# Patient Record
Sex: Male | Born: 1945
Health system: Southern US, Community
[De-identification: ages and names within clinical notes are randomized; demographics above are authoritative.]

## PROBLEM LIST (undated history)

## (undated) DIAGNOSIS — I10 Essential (primary) hypertension: Secondary | ICD-10-CM

## (undated) DIAGNOSIS — I2510 Atherosclerotic heart disease of native coronary artery without angina pectoris: Secondary | ICD-10-CM

## (undated) DIAGNOSIS — G4730 Sleep apnea, unspecified: Secondary | ICD-10-CM

## (undated) DIAGNOSIS — E785 Hyperlipidemia, unspecified: Secondary | ICD-10-CM

## (undated) DIAGNOSIS — R7303 Prediabetes: Secondary | ICD-10-CM

## (undated) DIAGNOSIS — Z9109 Other allergy status, other than to drugs and biological substances: Secondary | ICD-10-CM

## (undated) HISTORY — PX: UVULOPALATOPHARYNGOPLASTY: SHX827

## (undated) HISTORY — DX: Other allergy status, other than to drugs and biological substances: Z91.09

## (undated) HISTORY — DX: Sleep apnea, unspecified: G47.30

## (undated) HISTORY — PX: THROAT SURGERY: SHX803

---

## 1998-02-24 ENCOUNTER — Ambulatory Visit: Admission: RE | Admit: 1998-02-24 | Discharge: 1998-02-24 | Payer: Self-pay | Admitting: Cardiovascular Disease

## 1998-08-10 ENCOUNTER — Ambulatory Visit (HOSPITAL_COMMUNITY): Admission: RE | Admit: 1998-08-10 | Discharge: 1998-08-10 | Payer: Self-pay | Admitting: Family Medicine

## 2003-09-15 ENCOUNTER — Emergency Department (HOSPITAL_COMMUNITY): Admission: EM | Admit: 2003-09-15 | Discharge: 2003-09-15 | Payer: Self-pay

## 2003-10-08 HISTORY — PX: SPLENECTOMY, TOTAL: SHX788

## 2004-03-22 ENCOUNTER — Inpatient Hospital Stay (HOSPITAL_COMMUNITY): Admission: EM | Admit: 2004-03-22 | Discharge: 2004-03-26 | Payer: Self-pay | Admitting: Emergency Medicine

## 2004-08-08 ENCOUNTER — Ambulatory Visit: Payer: Self-pay | Admitting: Oncology

## 2004-08-20 ENCOUNTER — Inpatient Hospital Stay (HOSPITAL_COMMUNITY): Admission: RE | Admit: 2004-08-20 | Discharge: 2004-08-23 | Payer: Self-pay | Admitting: General Surgery

## 2004-10-19 ENCOUNTER — Ambulatory Visit: Payer: Self-pay | Admitting: Oncology

## 2004-10-24 ENCOUNTER — Encounter: Admission: RE | Admit: 2004-10-24 | Discharge: 2004-10-24 | Payer: Self-pay | Admitting: Neurological Surgery

## 2004-12-11 ENCOUNTER — Ambulatory Visit: Payer: Self-pay | Admitting: Oncology

## 2005-01-08 ENCOUNTER — Ambulatory Visit: Admission: RE | Admit: 2005-01-08 | Discharge: 2005-01-08 | Payer: Self-pay | Admitting: Oncology

## 2005-01-31 ENCOUNTER — Ambulatory Visit: Payer: Self-pay | Admitting: Oncology

## 2005-05-17 ENCOUNTER — Ambulatory Visit: Payer: Self-pay | Admitting: Oncology

## 2005-08-16 ENCOUNTER — Ambulatory Visit: Payer: Self-pay | Admitting: Oncology

## 2006-02-20 ENCOUNTER — Ambulatory Visit: Payer: Self-pay | Admitting: Oncology

## 2006-06-19 ENCOUNTER — Ambulatory Visit: Payer: Self-pay | Admitting: Oncology

## 2006-08-20 ENCOUNTER — Ambulatory Visit: Payer: Self-pay | Admitting: Oncology

## 2006-10-02 ENCOUNTER — Ambulatory Visit: Payer: Self-pay | Admitting: Oncology

## 2011-03-14 ENCOUNTER — Telehealth: Payer: Self-pay | Admitting: *Deleted

## 2011-03-14 NOTE — Telephone Encounter (Signed)
Per CY okay to put pt on for Wed 03-20-11 at 1115am for sleep consult. Pt is aware of appt time and date and is happy with this.

## 2011-03-14 NOTE — Telephone Encounter (Signed)
Spoke with pt and advised needs to sched consult with CDY to obtain new CPAP machine. I scheduled him for the first available appointment with CDY- 04/19/11 at 2 pm.  He is frustrated b/c person who took original msg advised the pt that it would probably be Monday 03/18/11 that we could see him. We wants to check with CDY about this. Please advise if he can be worked in sooner, since was under the impression already that this would not be an issue.

## 2011-03-14 NOTE — Telephone Encounter (Signed)
[email or web address] --pt will need consult with CY for sleep/cpap machine per Katie.  thanks

## 2011-03-18 ENCOUNTER — Encounter: Payer: Self-pay | Admitting: Internal Medicine

## 2011-03-20 ENCOUNTER — Ambulatory Visit (INDEPENDENT_AMBULATORY_CARE_PROVIDER_SITE_OTHER): Payer: Medicare Other | Admitting: Internal Medicine

## 2011-03-20 ENCOUNTER — Encounter: Payer: Self-pay | Admitting: Internal Medicine

## 2011-03-20 VITALS — BP 122/82 | HR 93 | Ht 70.0 in | Wt 310.2 lb

## 2011-03-20 DIAGNOSIS — G4733 Obstructive sleep apnea (adult) (pediatric): Secondary | ICD-10-CM

## 2011-03-20 NOTE — Progress Notes (Signed)
  Subjective:    Patient ID: Kevin Best, male    DOB: Feb 01, 1946, 65 y.o.   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 I had read his original NPSG 02/24/98- AHI 101.7/hr/ CPAP titrated to 13 cwp. He has used CPAP almost continuously since and wife confirms he snores and jerks in his sleep without it. Now needing a new machine. Sleep questions reviewed and charted. Using nasal mask.  He had a UPPP by Dr Haroldine Laws 20 years ago.  Review of Systems Constitutional:   No weight loss, night sweats,  Fevers, chills, fatigue, lassitude. HEENT:   No headaches,  Difficulty swallowing,  Tooth/dental problems,  Sore throat,                No sneezing, itching, ear ache, nasal congestion, post nasal drip,   CV:  No chest pain,  Orthopnea, PND, swelling in lower extremities, anasarca, dizziness, palpitations  GI  No heartburn, indigestion, abdominal pain, nausea, vomiting, diarrhea, change in bowel habits, loss of appetite  Resp: No shortness of breath with exertion or at rest.  No excess mucus, no productive cough,  No non-productive cough,  No coughing up of blood.  No change in color of mucus.  No wheezing.  No chest wall deformity  Skin: no rash or lesions.  GU: no dysuria, change in color of urine, no urgency or frequency.  No flank pain.  MS:  No joint pain or swelling.  No decreased range of motion.  No back pain.  Psych:  No change in mood or affect. No depression or anxiety.  No memory loss.      Objective:   Physical Exam General- Alert, Oriented, Affect-appropriate, Distress- none acute  obese  Skin- rash-none, lesions- none, excoriation- none  Lymphadenopathy- none  Head- atraumatic  Eyes- Gross vision intact, PERRLA, conjunctivae clear secretions  Ears- Hearing, canals, Tm - normal  Nose- Clear, No-Septal dev, mucus, polyps, erosion, perforation   Throat- Mallampati IV and  s/p UPPP     , mucosa clear , drainage- none, tonsils- atrophic  Neck- flexible , trachea midline, no stridor , thyroid nl, carotid no bruit  Chest - symmetrical excursion , unlabored     Heart/CV- RRR , no murmur , no gallop  , no rub, nl s1 s2                     - JVD- none , edema- none, stasis changes- none, varices- none     Lung- clear to P&A, wheeze- none, cough- none , dullness-none, rub- none     Chest wall-  Abd- tender-no, distended-no, bowel sounds-present, HSM- no  Br/ Gen/ Rectal- Not done, not indicated  Extrem- cyanosis- none, clubbing, none, atrophy- none, strength- nl  Neuro- grossly intact to observation         Assessment & Plan:

## 2011-03-20 NOTE — Patient Instructions (Signed)
Order- [facility]-  [facility]- Replace old CPAP machine, heated humidifier. Set at his current pressure per [facility] records, and ask what that pressure is for our records.  Dx OSA

## 2011-03-20 NOTE — Assessment & Plan Note (Addendum)
We will look for an old paper chart with original sleep studies Meanwhile, we will order replacement of his worn out machine, asking [facility] to set it on his prescribed presure of record. We will see if he is required to update documentation.

## 2011-03-24 ENCOUNTER — Encounter: Payer: Self-pay | Admitting: Internal Medicine

## 2011-04-03 ENCOUNTER — Telehealth: Payer: Self-pay | Admitting: Internal Medicine

## 2011-04-03 NOTE — Telephone Encounter (Signed)
Spoke with pt. He states that he has yet to receive his new CPAP machine and needs this asap. [facility] sent him an e-mail with "a bunch of jibrish" and asks that we call them to find out what is needed. I advised that I am unsure, [facility] sent the order to them already. Will forward to [facility]. Please help thanks!

## 2011-04-04 NOTE — Telephone Encounter (Signed)
Pt will be called today by [facility] order was received by them and was waiting on paperwork they now have

## 2011-04-19 ENCOUNTER — Institutional Professional Consult (permissible substitution): Payer: Self-pay | Admitting: Internal Medicine

## 2011-07-31 ENCOUNTER — Other Ambulatory Visit: Payer: Self-pay | Admitting: Physician Assistant

## 2011-10-13 DIAGNOSIS — J209 Acute bronchitis, unspecified: Secondary | ICD-10-CM | POA: Diagnosis not present

## 2011-10-13 DIAGNOSIS — R5381 Other malaise: Secondary | ICD-10-CM | POA: Diagnosis not present

## 2011-10-13 DIAGNOSIS — R5383 Other fatigue: Secondary | ICD-10-CM | POA: Diagnosis not present

## 2011-10-13 DIAGNOSIS — R05 Cough: Secondary | ICD-10-CM | POA: Diagnosis not present

## 2011-11-01 DIAGNOSIS — I10 Essential (primary) hypertension: Secondary | ICD-10-CM | POA: Diagnosis not present

## 2011-11-01 DIAGNOSIS — M109 Gout, unspecified: Secondary | ICD-10-CM | POA: Diagnosis not present

## 2011-11-01 DIAGNOSIS — H6690 Otitis media, unspecified, unspecified ear: Secondary | ICD-10-CM | POA: Diagnosis not present

## 2012-04-23 DIAGNOSIS — Z136 Encounter for screening for cardiovascular disorders: Secondary | ICD-10-CM | POA: Diagnosis not present

## 2012-04-23 DIAGNOSIS — G4733 Obstructive sleep apnea (adult) (pediatric): Secondary | ICD-10-CM | POA: Diagnosis not present

## 2012-04-23 DIAGNOSIS — Q8909 Congenital malformations of spleen: Secondary | ICD-10-CM | POA: Diagnosis not present

## 2012-04-23 DIAGNOSIS — Z1211 Encounter for screening for malignant neoplasm of colon: Secondary | ICD-10-CM | POA: Diagnosis not present

## 2012-04-23 DIAGNOSIS — Z125 Encounter for screening for malignant neoplasm of prostate: Secondary | ICD-10-CM | POA: Diagnosis not present

## 2012-04-23 DIAGNOSIS — Z0000 Encounter for general adult medical examination without abnormal findings: Secondary | ICD-10-CM | POA: Diagnosis not present

## 2012-04-23 DIAGNOSIS — M109 Gout, unspecified: Secondary | ICD-10-CM | POA: Diagnosis not present

## 2012-04-23 DIAGNOSIS — I10 Essential (primary) hypertension: Secondary | ICD-10-CM | POA: Diagnosis not present

## 2012-04-23 DIAGNOSIS — Z23 Encounter for immunization: Secondary | ICD-10-CM | POA: Diagnosis not present

## 2012-04-23 DIAGNOSIS — Z1159 Encounter for screening for other viral diseases: Secondary | ICD-10-CM | POA: Diagnosis not present

## 2012-06-15 DIAGNOSIS — Z23 Encounter for immunization: Secondary | ICD-10-CM | POA: Diagnosis not present

## 2012-12-03 ENCOUNTER — Encounter (HOSPITAL_COMMUNITY): Payer: Self-pay | Admitting: *Deleted

## 2012-12-03 ENCOUNTER — Emergency Department (HOSPITAL_COMMUNITY)
Admission: EM | Admit: 2012-12-03 | Discharge: 2012-12-03 | Disposition: A | Discharge status: Home / Self Care | Payer: Medicare Other | Attending: Emergency Medicine | Admitting: Emergency Medicine

## 2012-12-03 DIAGNOSIS — Z8669 Personal history of other diseases of the nervous system and sense organs: Secondary | ICD-10-CM | POA: Diagnosis not present

## 2012-12-03 DIAGNOSIS — R040 Epistaxis: Secondary | ICD-10-CM | POA: Diagnosis not present

## 2012-12-03 DIAGNOSIS — Z8709 Personal history of other diseases of the respiratory system: Secondary | ICD-10-CM | POA: Insufficient documentation

## 2012-12-03 NOTE — ED Provider Notes (Signed)
History     CSN: [id number]  Arrival date & time 12/03/12  0101   First MD Initiated Contact with Patient 12/03/12 0202      Chief Complaint  Patient presents with  . Epistaxis    (Consider location/radiation/quality/duration/timing/severity/associated sxs/prior treatment) Patient is a 67 y.o. male presenting with nosebleeds. The history is provided by the patient.  Epistaxis    patient plan of epistaxis from his right nares that started last night. Bleeding has been controlled this time. Prior history of nosebleed from the right side. Denies any digital trauma. No bleeding in the back of his throat. No bruising of his body. No weakness or lightheadedness with standing. Applied direct pressure with good results  Past Medical History  Diagnosis Date  . Environmental allergies   . Sleep apnea   . ALLERGIC RHINITIS     Past Surgical History  Procedure Laterality Date  . Splenectomy, total  2005  . Throat surgery    . Uvulopalatopharyngoplasty      Family History  Problem Relation Age of Onset  . Melanoma Father     History  Substance Use Topics  . Smoking status: Never Smoker   . Smokeless tobacco: Not on file  . Alcohol Use: No      Review of Systems  HENT: Positive for nosebleeds.   All other systems reviewed and are negative.    Allergies  Review of patient's allergies indicates no known allergies.  Home Medications  No current outpatient prescriptions on file.  BP 138/89  Pulse 100  Temp(Src) 96.8 F (36 C) (Oral)  Resp 18  SpO2 96%  Physical Exam  Nursing note and vitals reviewed. Constitutional: He is oriented to person, place, and time. He appears well-developed and well-nourished.  Non-toxic appearance. No distress.  HENT:  Head: Normocephalic and atraumatic.  Right nares with intact packing. No blood in the posterior pharynx. Left nares without acute bleeding  Eyes: Conjunctivae, EOM and lids are normal. Pupils are equal, round, and  reactive to light.  Neck: Normal range of motion. Neck supple. No tracheal deviation present. No mass present.  Cardiovascular: Normal rate, regular rhythm and normal heart sounds.  Exam reveals no gallop.   No murmur heard. Pulmonary/Chest: Effort normal and breath sounds normal. No stridor. No respiratory distress. He has no decreased breath sounds. He has no wheezes. He has no rhonchi. He has no rales.  Abdominal: Soft. Normal appearance and bowel sounds are normal. He exhibits no distension. There is no tenderness. There is no rebound and no CVA tenderness.  Musculoskeletal: Normal range of motion. He exhibits no edema and no tenderness.  Neurological: He is alert and oriented to person, place, and time. He has normal strength. No cranial nerve deficit or sensory deficit. GCS eye subscore is 4. GCS verbal subscore is 5. GCS motor subscore is 6.  Skin: Skin is warm and dry. No abrasion and no rash noted.  Psychiatric: He has a normal mood and affect. His speech is normal and behavior is normal.    ED Course  Procedures (including critical care time)  Labs Reviewed - No data to display No results found.   No diagnosis found.    MDM  Patient offered to have his packing removed in the right side which she has deferred. Encouraged to return should the bleeding come back        Toy Baker, MD 12/03/12 [phone]

## 2012-12-03 NOTE — ED Notes (Signed)
Pt states nosebleed since 7pm. Denies home medications.

## 2013-01-15 DIAGNOSIS — R05 Cough: Secondary | ICD-10-CM | POA: Diagnosis not present

## 2013-01-29 DIAGNOSIS — J069 Acute upper respiratory infection, unspecified: Secondary | ICD-10-CM | POA: Diagnosis not present

## 2013-12-08 DIAGNOSIS — J019 Acute sinusitis, unspecified: Secondary | ICD-10-CM | POA: Diagnosis not present

## 2013-12-08 DIAGNOSIS — R350 Frequency of micturition: Secondary | ICD-10-CM | POA: Diagnosis not present

## 2013-12-08 DIAGNOSIS — N390 Urinary tract infection, site not specified: Secondary | ICD-10-CM | POA: Diagnosis not present

## 2014-09-26 DIAGNOSIS — G4733 Obstructive sleep apnea (adult) (pediatric): Secondary | ICD-10-CM | POA: Diagnosis not present

## 2014-09-26 DIAGNOSIS — Z0000 Encounter for general adult medical examination without abnormal findings: Secondary | ICD-10-CM | POA: Diagnosis not present

## 2014-09-26 DIAGNOSIS — R7309 Other abnormal glucose: Secondary | ICD-10-CM | POA: Diagnosis not present

## 2014-09-26 DIAGNOSIS — Q8901 Asplenia (congenital): Secondary | ICD-10-CM | POA: Diagnosis not present

## 2014-09-26 DIAGNOSIS — M109 Gout, unspecified: Secondary | ICD-10-CM | POA: Diagnosis not present

## 2014-09-26 DIAGNOSIS — Z1389 Encounter for screening for other disorder: Secondary | ICD-10-CM | POA: Diagnosis not present

## 2014-09-26 DIAGNOSIS — Z1211 Encounter for screening for malignant neoplasm of colon: Secondary | ICD-10-CM | POA: Diagnosis not present

## 2014-09-26 DIAGNOSIS — Z23 Encounter for immunization: Secondary | ICD-10-CM | POA: Diagnosis not present

## 2014-09-26 DIAGNOSIS — I10 Essential (primary) hypertension: Secondary | ICD-10-CM | POA: Diagnosis not present

## 2014-09-26 DIAGNOSIS — Z125 Encounter for screening for malignant neoplasm of prostate: Secondary | ICD-10-CM | POA: Diagnosis not present

## 2014-11-01 DIAGNOSIS — Z1211 Encounter for screening for malignant neoplasm of colon: Secondary | ICD-10-CM | POA: Diagnosis not present

## 2014-12-20 DIAGNOSIS — J019 Acute sinusitis, unspecified: Secondary | ICD-10-CM | POA: Diagnosis not present

## 2014-12-20 DIAGNOSIS — J189 Pneumonia, unspecified organism: Secondary | ICD-10-CM | POA: Diagnosis not present

## 2014-12-27 DIAGNOSIS — Q8901 Asplenia (congenital): Secondary | ICD-10-CM | POA: Diagnosis not present

## 2014-12-27 DIAGNOSIS — R7309 Other abnormal glucose: Secondary | ICD-10-CM | POA: Diagnosis not present

## 2014-12-27 DIAGNOSIS — M109 Gout, unspecified: Secondary | ICD-10-CM | POA: Diagnosis not present

## 2014-12-27 DIAGNOSIS — G4733 Obstructive sleep apnea (adult) (pediatric): Secondary | ICD-10-CM | POA: Diagnosis not present

## 2014-12-27 DIAGNOSIS — I10 Essential (primary) hypertension: Secondary | ICD-10-CM | POA: Diagnosis not present

## 2014-12-27 DIAGNOSIS — J189 Pneumonia, unspecified organism: Secondary | ICD-10-CM | POA: Diagnosis not present

## 2015-02-14 DIAGNOSIS — R3919 Other difficulties with micturition: Secondary | ICD-10-CM | POA: Diagnosis not present

## 2015-02-14 DIAGNOSIS — I10 Essential (primary) hypertension: Secondary | ICD-10-CM | POA: Diagnosis not present

## 2015-02-14 DIAGNOSIS — M545 Low back pain: Secondary | ICD-10-CM | POA: Diagnosis not present

## 2015-02-28 DIAGNOSIS — R899 Unspecified abnormal finding in specimens from other organs, systems and tissues: Secondary | ICD-10-CM | POA: Diagnosis not present

## 2015-03-30 DIAGNOSIS — Q8901 Asplenia (congenital): Secondary | ICD-10-CM | POA: Diagnosis not present

## 2015-03-30 DIAGNOSIS — R7309 Other abnormal glucose: Secondary | ICD-10-CM | POA: Diagnosis not present

## 2015-03-30 DIAGNOSIS — I10 Essential (primary) hypertension: Secondary | ICD-10-CM | POA: Diagnosis not present

## 2015-03-30 DIAGNOSIS — N529 Male erectile dysfunction, unspecified: Secondary | ICD-10-CM | POA: Diagnosis not present

## 2015-03-30 DIAGNOSIS — M109 Gout, unspecified: Secondary | ICD-10-CM | POA: Diagnosis not present

## 2015-03-30 DIAGNOSIS — G4733 Obstructive sleep apnea (adult) (pediatric): Secondary | ICD-10-CM | POA: Diagnosis not present

## 2015-05-11 DIAGNOSIS — J069 Acute upper respiratory infection, unspecified: Secondary | ICD-10-CM | POA: Diagnosis not present

## 2015-05-11 DIAGNOSIS — H6691 Otitis media, unspecified, right ear: Secondary | ICD-10-CM | POA: Diagnosis not present

## 2015-05-23 ENCOUNTER — Inpatient Hospital Stay (HOSPITAL_COMMUNITY): Payer: Medicare Other

## 2015-05-23 ENCOUNTER — Encounter (HOSPITAL_COMMUNITY): Payer: Self-pay | Admitting: Neurology

## 2015-05-23 ENCOUNTER — Encounter (HOSPITAL_COMMUNITY)
Admission: EM | Disposition: A | Discharge status: Home / Self Care | Payer: Self-pay | Source: Home / Self Care | Attending: Cardiology

## 2015-05-23 ENCOUNTER — Inpatient Hospital Stay (HOSPITAL_COMMUNITY)
Admission: EM | Admit: 2015-05-23 | Discharge: 2015-05-25 | DRG: 247 | Disposition: A | Discharge status: Home / Self Care | Payer: Medicare Other | Attending: Cardiology | Admitting: Cardiology

## 2015-05-23 DIAGNOSIS — E785 Hyperlipidemia, unspecified: Secondary | ICD-10-CM | POA: Diagnosis not present

## 2015-05-23 DIAGNOSIS — I2119 ST elevation (STEMI) myocardial infarction involving other coronary artery of inferior wall: Principal | ICD-10-CM | POA: Diagnosis present

## 2015-05-23 DIAGNOSIS — I252 Old myocardial infarction: Secondary | ICD-10-CM | POA: Diagnosis not present

## 2015-05-23 DIAGNOSIS — I2510 Atherosclerotic heart disease of native coronary artery without angina pectoris: Secondary | ICD-10-CM | POA: Diagnosis not present

## 2015-05-23 DIAGNOSIS — Z7901 Long term (current) use of anticoagulants: Secondary | ICD-10-CM | POA: Diagnosis not present

## 2015-05-23 DIAGNOSIS — Z79899 Other long term (current) drug therapy: Secondary | ICD-10-CM | POA: Diagnosis not present

## 2015-05-23 DIAGNOSIS — Z7982 Long term (current) use of aspirin: Secondary | ICD-10-CM

## 2015-05-23 DIAGNOSIS — G4733 Obstructive sleep apnea (adult) (pediatric): Secondary | ICD-10-CM | POA: Diagnosis present

## 2015-05-23 DIAGNOSIS — R079 Chest pain, unspecified: Secondary | ICD-10-CM

## 2015-05-23 DIAGNOSIS — Z9081 Acquired absence of spleen: Secondary | ICD-10-CM | POA: Diagnosis present

## 2015-05-23 DIAGNOSIS — Z6841 Body Mass Index (BMI) 40.0 and over, adult: Secondary | ICD-10-CM

## 2015-05-23 DIAGNOSIS — R0789 Other chest pain: Secondary | ICD-10-CM | POA: Diagnosis not present

## 2015-05-23 DIAGNOSIS — I2111 ST elevation (STEMI) myocardial infarction involving right coronary artery: Secondary | ICD-10-CM

## 2015-05-23 DIAGNOSIS — I10 Essential (primary) hypertension: Secondary | ICD-10-CM | POA: Diagnosis present

## 2015-05-23 DIAGNOSIS — I213 ST elevation (STEMI) myocardial infarction of unspecified site: Secondary | ICD-10-CM

## 2015-05-23 DIAGNOSIS — E118 Type 2 diabetes mellitus with unspecified complications: Secondary | ICD-10-CM | POA: Diagnosis not present

## 2015-05-23 DIAGNOSIS — R319 Hematuria, unspecified: Secondary | ICD-10-CM | POA: Diagnosis present

## 2015-05-23 DIAGNOSIS — R7303 Prediabetes: Secondary | ICD-10-CM

## 2015-05-23 HISTORY — DX: Prediabetes: R73.03

## 2015-05-23 HISTORY — DX: Essential (primary) hypertension: I10

## 2015-05-23 HISTORY — DX: Atherosclerotic heart disease of native coronary artery without angina pectoris: I25.10

## 2015-05-23 HISTORY — PX: CARDIAC CATHETERIZATION: SHX172

## 2015-05-23 HISTORY — DX: Hyperlipidemia, unspecified: E78.5

## 2015-05-23 LAB — HEPATIC FUNCTION PANEL
ALBUMIN: 3.7 g/dL (ref 3.5–5.0)
ALK PHOS: 92 U/L (ref 38–126)
ALT: 78 U/L — AB (ref 17–63)
AST: 69 U/L — AB (ref 15–41)
Bilirubin, Direct: 0.2 mg/dL (ref 0.1–0.5)
Indirect Bilirubin: 0.6 mg/dL (ref 0.3–0.9)
TOTAL PROTEIN: 7.1 g/dL (ref 6.5–8.1)
Total Bilirubin: 0.8 mg/dL (ref 0.3–1.2)

## 2015-05-23 LAB — LIPID PANEL
CHOL/HDL RATIO: 5.1 ratio
Cholesterol: 189 mg/dL (ref 0–200)
HDL: 37 mg/dL — AB (ref >40)
LDL CALC: 124 mg/dL — AB (ref 0–99)
Triglycerides: 142 mg/dL (ref <150)
VLDL: 28 mg/dL (ref 0–40)

## 2015-05-23 LAB — CBC
HEMATOCRIT: 54.9 % — AB (ref 39.0–52.0)
HEMOGLOBIN: 19.1 g/dL — AB (ref 13.0–17.0)
MCH: 33.3 pg (ref 26.0–34.0)
MCHC: 34.8 g/dL (ref 30.0–36.0)
MCV: 95.8 fL (ref 78.0–100.0)
Platelets: 187 10*3/uL (ref 150–400)
RBC: 5.73 MIL/uL (ref 4.22–5.81)
RDW: 13.4 % (ref 11.5–15.5)
WBC: 16.6 10*3/uL — AB (ref 4.0–10.5)

## 2015-05-23 LAB — BASIC METABOLIC PANEL
ANION GAP: 12 (ref 5–15)
BUN: 13 mg/dL (ref 6–20)
CHLORIDE: 105 mmol/L (ref 101–111)
CO2: 20 mmol/L — AB (ref 22–32)
CREATININE: 1.11 mg/dL (ref 0.61–1.24)
Calcium: 9.2 mg/dL (ref 8.9–10.3)
GFR calc non Af Amer: >60 mL/min (ref >60)
Glucose, Bld: 153 mg/dL — ABNORMAL HIGH (ref 65–99)
POTASSIUM: 4.5 mmol/L (ref 3.5–5.1)
Sodium: 137 mmol/L (ref 135–145)

## 2015-05-23 LAB — POCT I-STAT, CHEM 8
BUN: 16 mg/dL (ref 6–20)
Calcium, Ion: 1.18 mmol/L (ref 1.13–1.30)
Chloride: 105 mmol/L (ref 101–111)
Creatinine, Ser: 1 mg/dL (ref 0.61–1.24)
Glucose, Bld: 146 mg/dL — ABNORMAL HIGH (ref 65–99)
HEMATOCRIT: 57 % — AB (ref 39.0–52.0)
HEMOGLOBIN: 19.4 g/dL — AB (ref 13.0–17.0)
Potassium: 4.1 mmol/L (ref 3.5–5.1)
SODIUM: 140 mmol/L (ref 135–145)
TCO2: 21 mmol/L (ref 0–100)

## 2015-05-23 LAB — PROTIME-INR
INR: 0.98 (ref 0.00–1.49)
PROTHROMBIN TIME: 13.2 s (ref 11.6–15.2)

## 2015-05-23 LAB — POCT I-STAT TROPONIN I: Troponin i, poc: 0.48 ng/mL (ref 0.00–0.08)

## 2015-05-23 LAB — CK TOTAL AND CKMB (NOT AT ARMC)
CK, MB: 13.9 ng/mL — ABNORMAL HIGH (ref 0.5–5.0)
RELATIVE INDEX: 6.5 — AB (ref 0.0–2.5)
Total CK: 213 U/L (ref 49–397)

## 2015-05-23 LAB — MRSA PCR SCREENING: MRSA BY PCR: NEGATIVE

## 2015-05-23 LAB — APTT: aPTT: 30 seconds (ref 24–37)

## 2015-05-23 LAB — TROPONIN I
Troponin I: 0.32 ng/mL — ABNORMAL HIGH (ref <0.031)
Troponin I: 2.99 ng/mL (ref <0.031)

## 2015-05-23 LAB — POCT ACTIVATED CLOTTING TIME: Activated Clotting Time: >1000 seconds

## 2015-05-23 SURGERY — LEFT HEART CATH AND CORONARY ANGIOGRAPHY
Anesthesia: LOCAL

## 2015-05-23 MED ORDER — VERAPAMIL HCL 2.5 MG/ML IV SOLN
INTRAVENOUS | Status: DC | PRN
  Administered 2015-05-23: 17:00:00 via INTRA_ARTERIAL

## 2015-05-23 MED ORDER — ATORVASTATIN CALCIUM 80 MG PO TABS: 80.0000 mg | ORAL_TABLET | Freq: Every day | ORAL | Status: DC

## 2015-05-23 MED ORDER — HEPARIN SODIUM (PORCINE) 5000 UNIT/ML IJ SOLN
5000.0000 [IU] | Freq: Three times a day (TID) | INTRAMUSCULAR | Status: DC
  Administered 2015-05-24 (×2): 5000 [IU] via SUBCUTANEOUS
  Filled 2015-05-23 (×4): qty 1

## 2015-05-23 MED ORDER — LIDOCAINE HCL (PF) 1 % IJ SOLN
INTRAMUSCULAR | Status: AC
  Filled 2015-05-23: qty 30

## 2015-05-23 MED ORDER — LIDOCAINE HCL (PF) 1 % IJ SOLN
INTRAMUSCULAR | Status: DC | PRN
  Administered 2015-05-23: 17:00:00

## 2015-05-23 MED ORDER — SODIUM CHLORIDE 0.9 % WEIGHT BASED INFUSION
1.0000 mL/kg/h | INTRAVENOUS | Status: AC
  Administered 2015-05-23: 1 mL/kg/h via INTRAVENOUS

## 2015-05-23 MED ORDER — TICAGRELOR 90 MG PO TABS
ORAL_TABLET | ORAL | Status: DC | PRN
  Administered 2015-05-23: 180 mg via ORAL

## 2015-05-23 MED ORDER — SODIUM CHLORIDE 0.9 % IJ SOLN: 3.0000 mL | INTRAMUSCULAR | Status: DC | PRN

## 2015-05-23 MED ORDER — ASPIRIN 81 MG PO CHEW
324.0000 mg | CHEWABLE_TABLET | Freq: Once | ORAL | Status: AC
  Administered 2015-05-23: 324 mg via ORAL

## 2015-05-23 MED ORDER — SODIUM CHLORIDE 0.9 % IV SOLN
1.0000 mg/kg/h | INTRAVENOUS | Status: AC
  Filled 2015-05-23: qty 250

## 2015-05-23 MED ORDER — VERAPAMIL HCL 2.5 MG/ML IV SOLN
INTRAVENOUS | Status: AC
  Filled 2015-05-23: qty 2

## 2015-05-23 MED ORDER — SODIUM CHLORIDE 0.9 % IJ SOLN
3.0000 mL | Freq: Two times a day (BID) | INTRAMUSCULAR | Status: DC
  Administered 2015-05-23 – 2015-05-24 (×2): 3 mL via INTRAVENOUS

## 2015-05-23 MED ORDER — BIVALIRUDIN BOLUS VIA INFUSION - CUPID
INTRAVENOUS | Status: DC | PRN
  Administered 2015-05-23 (×2): 105.75 mg via INTRAVENOUS

## 2015-05-23 MED ORDER — TICAGRELOR 90 MG PO TABS
ORAL_TABLET | ORAL | Status: AC
  Filled 2015-05-23: qty 1

## 2015-05-23 MED ORDER — NITROGLYCERIN IN D5W 200-5 MCG/ML-% IV SOLN
INTRAVENOUS | Status: AC
  Filled 2015-05-23: qty 250

## 2015-05-23 MED ORDER — NITROGLYCERIN IN D5W 200-5 MCG/ML-% IV SOLN
0.0000 ug/min | INTRAVENOUS | Status: DC
  Administered 2015-05-23: 25 ug/min via INTRAVENOUS

## 2015-05-23 MED ORDER — HEPARIN SODIUM (PORCINE) 5000 UNIT/ML IJ SOLN
4000.0000 [IU] | INTRAMUSCULAR | Status: AC
  Administered 2015-05-23: 4000 [IU] via INTRAVENOUS
  Filled 2015-05-23: qty 0.8

## 2015-05-23 MED ORDER — NITROGLYCERIN 1 MG/10 ML FOR IR/CATH LAB
INTRA_ARTERIAL | Status: DC | PRN
  Administered 2015-05-23: 20 ug via INTRACORONARY

## 2015-05-23 MED ORDER — NITROGLYCERIN IN D5W 200-5 MCG/ML-% IV SOLN
INTRAVENOUS | Status: DC | PRN
  Administered 2015-05-23: 10 ug/min via INTRAVENOUS

## 2015-05-23 MED ORDER — ATORVASTATIN CALCIUM 80 MG PO TABS
80.0000 mg | ORAL_TABLET | Freq: Every day | ORAL | Status: DC
  Administered 2015-05-23 – 2015-05-24 (×2): 80 mg via ORAL
  Filled 2015-05-23 (×2): qty 1

## 2015-05-23 MED ORDER — SODIUM CHLORIDE 0.9 % IV SOLN
0.2500 mg/kg/h | INTRAVENOUS | Status: DC
  Filled 2015-05-23: qty 250

## 2015-05-23 MED ORDER — BIVALIRUDIN 250 MG IV SOLR
INTRAVENOUS | Status: AC
  Filled 2015-05-23: qty 250

## 2015-05-23 MED ORDER — ASPIRIN EC 81 MG PO TBEC
81.0000 mg | DELAYED_RELEASE_TABLET | Freq: Every day | ORAL | Status: DC
  Administered 2015-05-24 – 2015-05-25 (×2): 81 mg via ORAL
  Filled 2015-05-23 (×2): qty 1

## 2015-05-23 MED ORDER — ACETAMINOPHEN 325 MG PO TABS
650.0000 mg | ORAL_TABLET | ORAL | Status: DC | PRN
Start: 2015-05-23 — End: 2015-05-25

## 2015-05-23 MED ORDER — SODIUM CHLORIDE 0.9 % IV SOLN: 250.0000 mL | INTRAVENOUS | Status: DC | PRN

## 2015-05-23 MED ORDER — TICAGRELOR 90 MG PO TABS
90.0000 mg | ORAL_TABLET | Freq: Two times a day (BID) | ORAL | Status: DC
  Administered 2015-05-24 – 2015-05-25 (×3): 90 mg via ORAL
  Filled 2015-05-23 (×3): qty 1

## 2015-05-23 MED ORDER — HEPARIN (PORCINE) IN NACL 2-0.9 UNIT/ML-% IJ SOLN
INTRAMUSCULAR | Status: AC
  Filled 2015-05-23: qty 1500

## 2015-05-23 MED ORDER — NITROGLYCERIN 0.4 MG SL SUBL: 0.4000 mg | SUBLINGUAL_TABLET | SUBLINGUAL | Status: DC | PRN

## 2015-05-23 MED ORDER — SODIUM CHLORIDE 0.9 % IV SOLN
250.0000 mg | INTRAVENOUS | Status: DC | PRN
  Administered 2015-05-23: 1.75 mg/kg/h via INTRAVENOUS

## 2015-05-23 MED ORDER — ONDANSETRON HCL 4 MG/2ML IJ SOLN: 4.0000 mg | Freq: Four times a day (QID) | INTRAMUSCULAR | Status: DC | PRN

## 2015-05-23 SURGICAL SUPPLY — 20 items
BALLN EUPHORA RX 2.5X12 (BALLOONS) ×3
BALLN ~~LOC~~ EUPHORA RX 4.5X12 (BALLOONS) ×3
BALLOON EUPHORA RX 2.5X12 (BALLOONS) ×2 IMPLANT
BALLOON ~~LOC~~ EUPHORA RX 4.5X12 (BALLOONS) ×2 IMPLANT
CATH INFINITI 5 FR JL3.5 (CATHETERS) ×3 IMPLANT
CATH INFINITI 5FR ANG PIGTAIL (CATHETERS) ×3 IMPLANT
CATH INFINITI JR4 5F (CATHETERS) ×3 IMPLANT
DEVICE RAD COMP TR BAND LRG (VASCULAR PRODUCTS) ×3 IMPLANT
GLIDESHEATH SLEND SS 6F .021 (SHEATH) ×3 IMPLANT
GUIDE CATH RUNWAY 6FR AL 75 (CATHETERS) ×3 IMPLANT
HOVERMATT SINGLE USE (MISCELLANEOUS) ×3 IMPLANT
KIT ENCORE 26 ADVANTAGE (KITS) ×3 IMPLANT
KIT HEART LEFT (KITS) ×3 IMPLANT
PACK CARDIAC CATHETERIZATION (CUSTOM PROCEDURE TRAY) ×3 IMPLANT
STENT SYNERGY DES 4X16 (Permanent Stent) ×3 IMPLANT
SYR MEDRAD MARK V 150ML (SYRINGE) ×3 IMPLANT
TRANSDUCER W/STOPCOCK (MISCELLANEOUS) ×3 IMPLANT
TUBING CIL FLEX 10 FLL-RA (TUBING) ×3 IMPLANT
WIRE ASAHI PROWATER 180CM (WIRE) ×3 IMPLANT
WIRE SAFE-T 1.5MM-J .035X260CM (WIRE) ×3 IMPLANT

## 2015-05-23 NOTE — ED Provider Notes (Signed)
CSN: [id number]     Arrival date & time 05/23/15  1600 History   First MD Initiated Contact with Patient 05/23/15 1614     Chief Complaint  Patient presents with  . Chest Pain     (Consider location/radiation/quality/duration/timing/severity/associated sxs/prior Treatment) HPI  A LEVEL 5 CAVEAT PERTAINS DUE TO URGENT NEED FOR INTERVENTION Pt presenting with c/o chest pain.  He states chest pain started at 1pm and has continued over the past 3 hours.  Associated with diaphoresis and nausea.  Pain radiates to left arm.  He has had similar pain in the past, but today was worse.  No hx of MI or CAD.  Pt has not taken anything for his symptoms prior to arrival.  Pain currently 6/10.  No fainting.  He states over the past several days he has been feeling weak with no energy.  There are no other associated systemic symptoms, there are no other alleviating or modifying factors.   Past Medical History  Diagnosis Date  . Environmental allergies   . Sleep apnea   . ALLERGIC RHINITIS   . Hypertension   . Prediabetes 05/25/2015  . Hyperlipidemia 05/24/2015  . CAD (coronary artery disease)     s/p inferolateral STEMI 05/23/2015 DES to prox-mid RCA, 100% occluded RPLB 2   Past Surgical History  Procedure Laterality Date  . Splenectomy, total  2005  . Throat surgery    . Uvulopalatopharyngoplasty    . Cardiac catheterization N/A 05/23/2015    Procedure: Left Heart Cath and Coronary Angiography;  Surgeon: Peter [name], MD;  Location: [place] [facility];  Service: Cardiovascular;  Laterality: N/A;  . Cardiac catheterization  05/23/2015    Procedure: Coronary Stent Intervention;  Surgeon: Peter [name], MD;  Location: [place] [facility];  Service: Cardiovascular;;   Family History  Problem Relation Age of Onset  . Melanoma Father    Social History  Substance Use Topics  . Smoking status: Never Smoker   . Smokeless tobacco: None  . Alcohol Use: No    Review of Systems  UNABLE TO OBTAIN ROS  DUE TO LEVEL 5 CAVEAT    Allergies  Review of patient's allergies indicates no known allergies.  Home Medications   Prior to Admission medications   Medication Sig Start Date End Date Taking? Authorizing Provider  aspirin EC 81 MG EC tablet Take 1 tablet (81 mg total) by mouth daily. 05/25/15   Almyra Deforest, PA  atorvastatin (LIPITOR) 80 MG tablet Take 1 tablet (80 mg total) by mouth daily at 6 PM. 05/25/15   Almyra Deforest, PA  losartan (COZAAR) 100 MG tablet Take 100 mg by mouth daily.   Yes Historical Provider, MD  metoprolol succinate (TOPROL-XL) 25 MG 24 hr tablet Take 1 tablet (25 mg total) by mouth daily. 05/25/15   Almyra Deforest, PA  nitroGLYCERIN (NITROSTAT) 0.4 MG SL tablet Place 1 tablet (0.4 mg total) under the tongue every 5 (five) minutes x 3 doses as needed for chest pain. 05/25/15   Almyra Deforest, PA  ticagrelor (BRILINTA) 90 MG TABS tablet Take 1 tablet (90 mg total) by mouth 2 (two) times daily. 05/25/15   Almyra Deforest, PA   BP 126/74 mmHg  Pulse 73  Temp(Src) 98.1 F (36.7 C) (Oral)  Resp 20  Ht 5\' 10"  (1.778 m)  Wt 308 lb 3.3 oz (139.8 kg)  BMI 44.22 kg/m2  SpO2 95%  Vitals reivewed Physical Exam  Physical Examination: General appearance - alert, well appearing, and  in no distress Mental status - alert, oriented to person, place, and time Eyes - no conjunctival injection, no scleral icterus Mouth - mucous membranes moist, pharynx normal without lesions Chest - clear to auscultation, no wheezes, rales or rhonchi, symmetric air entry Heart - normal rate, regular rhythm, normal S1, S2, no murmurs, rubs, clicks or gallops Abdomen - soft, nontender, nondistended, no masses or organomegaly Neurological - alert, oriented, normal speech,  Extremities - peripheral pulses normal, no pedal edema, no clubbing or cyanosis Skin - normal coloration and turgor, no rashes  ED Course  Procedures (including critical care time)  CRITICAL CARE Performed by: Threasa Beards Total critical care time:  35 Critical care time was exclusive of separately billable procedures and treating other patients. Critical care was necessary to treat or prevent imminent or life-threatening deterioration. Critical care was time spent personally by me on the following activities: development of treatment plan with patient and/or surrogate as well as nursing, discussions with consultants, evaluation of patient's response to treatment, examination of patient, obtaining history from patient or surrogate, ordering and performing treatments and interventions, ordering and review of laboratory studies, ordering and review of radiographic studies, pulse oximetry and re-evaluation of patient's condition. Labs Review Labs Reviewed  BASIC METABOLIC PANEL - Abnormal; Notable for the following:    CO2 20 (*)    Glucose, Bld 153 (*)    All other components within normal limits  CBC - Abnormal; Notable for the following:    WBC 16.6 (*)    Hemoglobin 19.1 (*)    HCT 54.9 (*)    All other components within normal limits  CK TOTAL AND CKMB (NOT AT [facility]) - Abnormal; Notable for the following:    CK, MB 13.9 (*)    Relative Index 6.5 (*)    All other components within normal limits  HEPATIC FUNCTION PANEL - Abnormal; Notable for the following:    AST 69 (*)    ALT 78 (*)    All other components within normal limits  LIPID PANEL - Abnormal; Notable for the following:    HDL 37 (*)    LDL Cholesterol 124 (*)    All other components within normal limits  TROPONIN I - Abnormal; Notable for the following:    Troponin I 0.32 (*)    All other components within normal limits  HEMOGLOBIN A1C - Abnormal; Notable for the following:    Hgb A1c MFr Bld 12.8 (*)    All other components within normal limits  BASIC METABOLIC PANEL - Abnormal; Notable for the following:    Glucose, Bld 171 (*)    Calcium 8.8 (*)    All other components within normal limits  CBC - Abnormal; Notable for the following:    WBC 18.0 (*)     Hemoglobin 17.4 (*)    All other components within normal limits  TROPONIN I - Abnormal; Notable for the following:    Troponin I 2.99 (*)    All other components within normal limits  TROPONIN I - Abnormal; Notable for the following:    Troponin I 16.52 (*)    All other components within normal limits  TROPONIN I - Abnormal; Notable for the following:    Troponin I 21.59 (*)    All other components within normal limits  HEMOGLOBIN A1C - Abnormal; Notable for the following:    Hgb A1c MFr Bld 6.0 (*)    All other components within normal limits  LIPID PANEL - Abnormal; Notable  for the following:    HDL 35 (*)    LDL Cholesterol 120 (*)    All other components within normal limits  POCT I-STAT TROPONIN I - Abnormal; Notable for the following:    Troponin i, poc 0.48 (*)    All other components within normal limits  POCT I-STAT, CHEM 8 - Abnormal; Notable for the following:    Glucose, Bld 146 (*)    Hemoglobin 19.4 (*)    HCT 57.0 (*)    All other components within normal limits  MRSA PCR SCREENING  PROTIME-INR  APTT  POCT ACTIVATED CLOTTING TIME    Imaging Review No results found. I have personally reviewed and evaluated these images and lab results as part of my medical decision-making.    Date/Time:  Tuesday May 23 2015 16:09:22 EDT Ventricular Rate:  82 PR Interval:  186 QRS Duration: 102 QT Interval:  382 QTC Calculation: 446 R Axis:   14 Text Interpretation:   Critical Test Result: STEMI Normal sinus rhythm  Inferior infarct , possibly acute Anterolateral infarct , possibly acute  ACUTE MI / STEMI  Consider right ventricular involvement in  acute inferior infarct Abnormal ECG ST elevations are new Confirmed by  Canary Brim  MD,  [phone]) on 05/23/2015 4:24:32 PM MDM   Final diagnoses:  Chest pain  STEMI   4:26 PM pt brought back from triage, 2 IVs, on monitor.  Heparin and aspirin started.  Cardiology at bedside and plan to take to cath lab.      Alfonzo Beers, MD 05/26/15 [phone]

## 2015-05-23 NOTE — ED Notes (Signed)
MD [name] at the bedside.

## 2015-05-23 NOTE — Progress Notes (Signed)
Chaplain responded to Code Semi. Pt wife was at the bedside. Pt was taken to Cath lab. Chaplain escorted spouse to short stay waiting and provided refreshment. Chaplain will inform   05/23/15 1600  Clinical Encounter Type  Visited With Family  Visit Type Spiritual support  Referral From Care management  Spiritual Encounters  Spiritual Needs Emotional   on call Chaplain

## 2015-05-23 NOTE — ED Notes (Signed)
Pt reports this morning after he took a shower he developed central cp and became diaphoretic. C/o pain to left arm at current. Denies cardiac hx. Has been feeling weak for several days with no energy.

## 2015-05-23 NOTE — ED Notes (Signed)
Patient to be taken upstairs at this time. Patient alert and oriented x4. MD [name] at the bedside. Belongings given to family. Patient has Zoll Pads on the patient.

## 2015-05-23 NOTE — H&P (Signed)
Patient ID: Kevin Best MRN: [id number], DOB/AGE: [id number]   Admit date: 05/23/2015   Primary Physician: Dr. [name] Primary Cardiologist: new- Dr. [name]  Pt. Profile:  obese 69 year old Caucasian male with past medical history of hypertension and obstructive sleep apnea on CPAP who did not have prior h/o CAD presented with inferolateral STEMI   Problem List  Past Medical History  Diagnosis Date  . Environmental allergies   . Sleep apnea   . ALLERGIC RHINITIS   . Hypertension     Past Surgical History  Procedure Laterality Date  . Splenectomy, total  2005  . Throat surgery    . Uvulopalatopharyngoplasty       Allergies  No Known Allergies  HPI  The patient is a obese 69 year old Caucasian male with past medical history of hypertension and obstructive sleep apnea on CPAP. He never smoked, not drink and does not have any history of illicit drug use. He denies any significant family history of CAD nor did he ever have h/o CAD himself. He was brought to [facility] on 05/23/2015 after starting to have substernal chest pain around 1 PM radiating down the left arm while taking shower. He also endorsed diaphoresis. Per patient, he has been having intermittent chest discomfort for the past month. Initial EKG showed significant ST elevation >8mm in inferior lead and also V4-V6 with corresponding ST depression in V1 and V2. I will to [facility], he had continuous chest pain 6 out of 10. He was taken emergently to the cath lab for cardiac catheterization.  Per patient, denies any history of stroke, he had recent hematuria and was referred to urology as outpatient. Otherwise, at this time, he denies any significant shortness of breath. As for his blood pressure, the only medication he takes at home his losartan.  Home Medications  Prior to Admission medications   Not on File    Family History  Family History  Problem Relation Age of Onset  . Melanoma  Father     Social History  Social History   Social History  . Marital Status: Married    Spouse Name: N/A  . Number of Children: 3  . Years of Education: N/A   Occupational History  . reitred-construction    Social History Main Topics  . Smoking status: Never Smoker   . Smokeless tobacco: Not on file  . Alcohol Use: No  . Drug Use: No  . Sexual Activity: Not on file   Other Topics Concern  . Not on file   Social History Narrative     Review of Systems General:  No chills, fever Cardiovascular:  No edema, orthopnea, palpitations, paroxysmal nocturnal dyspnea. +chest pain radiating down L arm, diaphoresis. Dermatological: No rash Respiratory: No cough, dyspnea Urologic: No dysuria +occasional hematuria Abdominal:   No nausea, vomiting, diarrhea, bright red blood per rectum, melena, or hematemesis Neurologic:  No visual changes, wkns, changes in mental status. All other systems reviewed and are otherwise negative except as noted above.  Physical Exam  Blood pressure 186/111, pulse 88, temperature 97.8 F (36.6 C), temperature source Oral, resp. rate 18, SpO2 96 %.  General: Pleasant, NAD Psych: Normal affect. Neuro: Alert and oriented X 3. Moves all extremities spontaneously. HEENT: Normal  Neck: Supple without bruits or JVD. Lungs:  Resp regular and unlabored, anterior exam CTA. Heart: RRR no s3, s4, or murmurs. Abdomen: Soft, non-tender, non-distended, BS + x 4.  Extremities: No clubbing, cyanosis or edema. DP/PT/Radials  2+ and equal bilaterally.  Labs  Troponin [facility] of Care Test)  Recent Labs  05/23/15 1622  TROPIPOC 0.48*     ECG  Inferolateral STEMI with significant ST elevation in 2-3 aVF, ST elevation in V4 through V6, corresponding ST depression in V1 and V2.  ASSESSMENT AND PLAN  1. Inferolateral STEMI - initial episode  - does have intermittent hematuria with outpatient urology referral, otherwise no significant bleeding or h/o stroke  -  plan for urgent cardiac catheterization. Admit to ICU and obtain echocardiogram. Add statin, obtain lipid panel. MD to decide on BB base on his HR and BP during cath.   2. HTN: on home losartan  - CBC and BMET pending  3. OSA on CPAP  4. Morbid obesity  Signed, Almyra Deforest, Hershal Coria 05/23/2015, 4:36 PM Patient seen and examined and history reviewed. Agree with above findings and plan. 69 yo WM presents with acute chest pain. Ecg shows marked ST elevation in the inferior and lateral leads. He has a history of HTN. Has experienced chest pain off and on for one month. Felt very exhausted this past week. Will proceed with emergent cardiac cath and PCI.   [name], [place] 05/23/2015 5:41 PM

## 2015-05-24 ENCOUNTER — Encounter (HOSPITAL_COMMUNITY): Payer: Self-pay | Admitting: Cardiology

## 2015-05-24 ENCOUNTER — Inpatient Hospital Stay (HOSPITAL_COMMUNITY): Payer: Medicare Other

## 2015-05-24 DIAGNOSIS — E118 Type 2 diabetes mellitus with unspecified complications: Secondary | ICD-10-CM

## 2015-05-24 DIAGNOSIS — E785 Hyperlipidemia, unspecified: Secondary | ICD-10-CM

## 2015-05-24 DIAGNOSIS — I2510 Atherosclerotic heart disease of native coronary artery without angina pectoris: Secondary | ICD-10-CM

## 2015-05-24 HISTORY — DX: Hyperlipidemia, unspecified: E78.5

## 2015-05-24 LAB — BASIC METABOLIC PANEL
Anion gap: 9 (ref 5–15)
BUN: 12 mg/dL (ref 6–20)
CHLORIDE: 103 mmol/L (ref 101–111)
CO2: 24 mmol/L (ref 22–32)
Calcium: 8.8 mg/dL — ABNORMAL LOW (ref 8.9–10.3)
Creatinine, Ser: 1.13 mg/dL (ref 0.61–1.24)
GFR calc Af Amer: >60 mL/min (ref >60)
GFR calc non Af Amer: >60 mL/min (ref >60)
Glucose, Bld: 171 mg/dL — ABNORMAL HIGH (ref 65–99)
POTASSIUM: 4.1 mmol/L (ref 3.5–5.1)
SODIUM: 136 mmol/L (ref 135–145)

## 2015-05-24 LAB — CBC
HEMATOCRIT: 50.1 % (ref 39.0–52.0)
Hemoglobin: 17.4 g/dL — ABNORMAL HIGH (ref 13.0–17.0)
MCH: 33.2 pg (ref 26.0–34.0)
MCHC: 34.7 g/dL (ref 30.0–36.0)
MCV: 95.6 fL (ref 78.0–100.0)
Platelets: 197 10*3/uL (ref 150–400)
RBC: 5.24 MIL/uL (ref 4.22–5.81)
RDW: 13.4 % (ref 11.5–15.5)
WBC: 18 10*3/uL — AB (ref 4.0–10.5)

## 2015-05-24 LAB — LIPID PANEL
CHOLESTEROL: 173 mg/dL (ref 0–200)
HDL: 35 mg/dL — AB (ref >40)
LDL CALC: 120 mg/dL — AB (ref 0–99)
TRIGLYCERIDES: 92 mg/dL (ref <150)
Total CHOL/HDL Ratio: 4.9 RATIO
VLDL: 18 mg/dL (ref 0–40)

## 2015-05-24 LAB — TROPONIN I
TROPONIN I: 21.59 ng/mL — AB (ref <0.031)
Troponin I: 16.52 ng/mL (ref <0.031)

## 2015-05-24 LAB — HEMOGLOBIN A1C
Hgb A1c MFr Bld: 12.8 % — ABNORMAL HIGH (ref 4.8–5.6)
Mean Plasma Glucose: 321 mg/dL

## 2015-05-24 MED ORDER — PERFLUTREN LIPID MICROSPHERE
INTRAVENOUS | Status: AC
  Administered 2015-05-24: 10:00:00
  Filled 2015-05-24: qty 10

## 2015-05-24 MED ORDER — LOSARTAN POTASSIUM 50 MG PO TABS
100.0000 mg | ORAL_TABLET | Freq: Every day | ORAL | Status: DC
  Administered 2015-05-24 – 2015-05-25 (×2): 100 mg via ORAL
  Filled 2015-05-24 (×2): qty 2

## 2015-05-24 MED ORDER — HYDRALAZINE HCL 20 MG/ML IJ SOLN: 10.0000 mg | Freq: Four times a day (QID) | INTRAMUSCULAR | Status: DC | PRN

## 2015-05-24 MED ORDER — METOPROLOL SUCCINATE ER 25 MG PO TB24
25.0000 mg | ORAL_TABLET | Freq: Every day | ORAL | Status: DC
  Administered 2015-05-24 – 2015-05-25 (×2): 25 mg via ORAL
  Filled 2015-05-24 (×2): qty 1

## 2015-05-24 NOTE — Progress Notes (Signed)
Echocardiogram 2D Echocardiogram has been performed.  Kevin Best 05/24/2015, 10:31 AM

## 2015-05-24 NOTE — Progress Notes (Signed)
EKG CRITICAL VALUE     12 lead EKG performed.  Critical value noted.  Carin Primrose, RN notified.   ,  H, CCT 05/24/2015 7:02 AM

## 2015-05-24 NOTE — Care Management Note (Signed)
Case Management Note  Patient Details  Name: Kevin Best MRN: [id number] Date of Birth: 1945/11/06  Subjective/Objective:        Adm w mi            Action/Plan: lives w wife   Expected Discharge Date:                  Expected Discharge Plan:  Home/Self Care  In-House Referral:     Discharge planning Services  CM Consult, Medication Assistance  Post Acute Care Choice:    Choice offered to:     DME Arranged:    DME Agency:     HH Arranged:    [place] Agency:     Status of Service:     Medicare Important Message Given:    Date Medicare IM Given:    Medicare IM give by:    Date Additional Medicare IM Given:    Additional Medicare Important Message give by:     If discussed at [place] of Stay Meetings, dates discussed:    Additional Comments:ur review done. Gave pt 30day free brilinta card.  Lacretia Leigh, RN 05/24/2015, 9:32 AM

## 2015-05-24 NOTE — Progress Notes (Signed)
Inpatient Diabetes Program Recommendations  AACE/ADA: New Consensus Statement on Inpatient Glycemic Control (2013)  Target Ranges:  Prepandial:   less than 140 mg/dL      Peak postprandial:   less than 180 mg/dL (1-2 hours)      Critically ill patients:  140 - 180 mg/dL   Consult received for elevated A1C 12.8.  CBGs currently do not reflect glucose reported in A1C.  Recommend a repeat A1C to verify.   Will follow. Thank you  Raoul Pitch BSN, RN,CDE Inpatient Diabetes Coordinator [phone] (team pager)

## 2015-05-24 NOTE — Progress Notes (Signed)
CARDIAC REHAB PHASE I   PRE:  Rate/Rhythm: 79 SR c/ PVCs  BP:  Sitting: 169/77        SaO2: 98 RA  MODE:  Ambulation: 700 ft   POST:  Rate/Rhythm: 76 SR  BP:  Sitting: 156/73         SaO2: 98 RA  Pt ambulated 700 ft on RA, independent, brisk, steady gait, tolerated well.  Pt c/o of "feeling a little tired after walk", denies cp, dizziness, DOE, declined rest stop. Began MI/stent education with pt wife at bedside.  Reviewed risk factors, anti-platelet therapy, stent card, activity restrictions, ntg, exercise, and phase 2 cardiac rehab. Gave pt heart healthy diet and low sodium diet handouts to review, will discuss tomorrow. Pt A1C was elevated, however, Diabetes coordinator recommends redraw to verify accuracy. Will educate regarding diabetes diet if applicable. Pt verbalized understanding. Pt agrees to phase 2 cardiac rehab. Will send referral to [facility]. Pt and wife, very receptive to education; to recliner after walk, call bell within reach. Will follow-up tomorrow to finish education.  [phone]     Lenna Sciara, RN, BSN 05/24/2015 12:40 PM

## 2015-05-24 NOTE — Progress Notes (Addendum)
       Patient Name: Kevin Best Date of Encounter: 05/24/2015    SUBJECTIVE: He is doing well. Chest discomfort is completely resolved.  TELEMETRY:  Sinus rhythm. EKG demonstrates traumatic improvement in ST segments compared with admission and post PCI. This minimal inferior ST elevation still present. Filed Vitals:   05/24/15 0530 05/24/15 0600 05/24/15 0630 05/24/15 0923  BP: 134/47 127/71 142/69 165/54  Pulse: 61 58 65 67  Temp:      TempSrc:      Resp:      Height:      Weight:      SpO2: 96% 96% 98%     Intake/Output Summary (Last 24 hours) at 05/24/15 1036 Last data filed at 05/24/15 1000  Gross per 24 hour  Intake 1150.71 ml  Output   1400 ml  Net -249.29 ml   LABS: Basic Metabolic Panel:  Recent Labs  05/23/15 1616 05/23/15 1647 05/24/15 0525  NA 137 140 136  K 4.5 4.1 4.1  CL 105 105 103  CO2 20*  --  24  GLUCOSE 153* 146* 171*  BUN 13 16 12   CREATININE 1.11 1.00 1.13  CALCIUM 9.2  --  8.8*   CBC:  Recent Labs  05/23/15 1616 05/23/15 1647 05/24/15 0525  WBC 16.6*  --  18.0*  HGB 19.1* 19.4* 17.4*  HCT 54.9* 57.0* 50.1  MCV 95.8  --  95.6  PLT 187  --  197   Cardiac Enzymes:  Recent Labs  05/23/15 1620 05/23/15 1831 05/23/15 2322 05/24/15 0525  CKTOTAL 213  --   --   --   CKMB 13.9*  --   --   --   TROPONINI 0.32* 2.99* 16.52* 21.59*   BNP: Invalid input(s): POCBNP Hemoglobin A1C:  Recent Labs  05/23/15 1600  HGBA1C 12.8*   Fasting Lipid Panel:  Recent Labs  05/24/15 0525  CHOL 173  HDL 35*  LDLCALC 120*  TRIG 92  CHOLHDL 4.9    Radiology/Studies:  Chest x-ray reveal no acute process  Physical Exam: Blood pressure 165/54, pulse 67, temperature 98.2 F (36.8 C), temperature source Oral, resp. rate 16, height 5\' 10"  (1.778 m), weight 64.8 kg (142 lb 13.7 oz), SpO2 98 %. Weight change:   Wt Readings from Last 3 Encounters:  05/24/15 64.8 kg (142 lb 13.7 oz)  03/20/11 140.706 kg (310 lb 3.2 oz)    Morbid  obesity Diminished basal breath sounds posteriorly Cardiac exam does not reveal gallop or murmur Extremities reveal no edema Abdomen is obese. No significant tenderness is noted.   ASSESSMENT:  1. Acute inferior myocardial infarction, with ST elevation, treated with mechanical reperfusion and drug-eluting stent implantation. Posterolateral branch embolus with persistent occlusion at completion of the case. 2. Overall normal LV function with inferobasal hypokinesis 3. Morbid obesity and likely sleep apnea 4. Poor risk factor management with likely diabetes, and hypertension not previously well managed 5. Polycythemia, will need further workup to rule out hyperviscosity playing a role and acute infarct  Plan:  1. Start metoprolol succinate 25 mg daily 2. High intensity statin therapy 3. Diabetes teaching 4. Transfer to telemetry 5. Discussed the importance of dual antiplatelets therapy 6. Potential discharge in a.m. assuming all teaching his been done and no complications. 7. Because of polycythemia, he will need a hematology consult to rule out polycythemia vera Signed, SARAH, ZERBY 05/24/2015, 10:36 AM

## 2015-05-25 ENCOUNTER — Encounter (HOSPITAL_COMMUNITY): Payer: Self-pay | Admitting: Physician Assistant

## 2015-05-25 ENCOUNTER — Telehealth: Payer: Self-pay | Admitting: Cardiology

## 2015-05-25 DIAGNOSIS — E785 Hyperlipidemia, unspecified: Secondary | ICD-10-CM

## 2015-05-25 DIAGNOSIS — R7303 Prediabetes: Secondary | ICD-10-CM

## 2015-05-25 HISTORY — DX: Prediabetes: R73.03

## 2015-05-25 LAB — HEMOGLOBIN A1C
HEMOGLOBIN A1C: 6 % — AB (ref 4.8–5.6)
MEAN PLASMA GLUCOSE: 126 mg/dL

## 2015-05-25 MED ORDER — TICAGRELOR 90 MG PO TABS: 90.0000 mg | ORAL_TABLET | Freq: Two times a day (BID) | ORAL | Status: DC

## 2015-05-25 MED ORDER — LIVING WELL WITH DIABETES BOOK
Freq: Once | Status: AC
  Administered 2015-05-25: 09:00:00
  Filled 2015-05-25: qty 1

## 2015-05-25 MED ORDER — METOPROLOL SUCCINATE ER 25 MG PO TB24: 25.0000 mg | ORAL_TABLET | Freq: Every day | ORAL | Status: DC

## 2015-05-25 MED ORDER — NITROGLYCERIN 0.4 MG SL SUBL: 0.4000 mg | SUBLINGUAL_TABLET | SUBLINGUAL | Status: DC | PRN

## 2015-05-25 MED ORDER — ATORVASTATIN CALCIUM 80 MG PO TABS: 80.0000 mg | ORAL_TABLET | Freq: Every day | ORAL | Status: DC

## 2015-05-25 MED ORDER — ASPIRIN 81 MG PO TBEC: 81.0000 mg | DELAYED_RELEASE_TABLET | Freq: Every day | ORAL | Status: AC

## 2015-05-25 NOTE — Telephone Encounter (Signed)
8/18 TCM per [facility] , Cecilie Kicks on 8/25@8am  left vm fro pt

## 2015-05-25 NOTE — Progress Notes (Signed)
Inpatient Diabetes Program Recommendations  AACE/ADA: New Consensus Statement on Inpatient Glycemic Control (2013)  Target Ranges:  Prepandial:   less than 140 mg/dL      Peak postprandial:   less than 180 mg/dL (1-2 hours)      Critically ill patients:  140 - 180 mg/dL   Inpatient Diabetes Program Recommendations HgbA1C: =6  Note: Repeat A1C is more consistent with current glucose levels.  A1C of 6 places patient in a "prediabetes" range.  Will order patient Living Well with Diabetes patient ed workbook.   Will need to follow-up with PCP for ongoing monitoring and management. Thank you  Raoul Pitch BSN, RN,CDE Inpatient Diabetes Coordinator [phone] (team pager)

## 2015-05-25 NOTE — Discharge Summary (Signed)
Discharge Summary   Patient ID: Kevin Best,  MRN: [id number], DOB/AGE: 71-Feb-1947 71 y.o.  Admit date: 05/23/2015 Discharge date: 05/25/2015  Primary Care Provider: Dr. [name] Primary Cardiologist: Dr. [name]  Discharge Diagnoses Principal Problem:   ST elevation myocardial infarction (STEMI) of inferior wall Active Problems:   Obstructive sleep apnea   HTN (hypertension)   Morbid obesity   Hyperlipidemia   Prediabetes   Allergies No Known Allergies  Procedures  Echocardiogram 05/24/2015 LV EF: 60% -  65%  ------------------------------------------------------------------- Indications:   CAD of native vessels 414.01.  ------------------------------------------------------------------- History:  PMH:  Myocardial infarction. Risk factors: Hypertension.  ------------------------------------------------------------------- Study Conclusions  - Left ventricle: The cavity size was normal. Wall thickness was increased in a pattern of mild LVH. Systolic function was normal. The estimated ejection fraction was in the range of 60% to 65%. Wall motion was normal; there were no regional wall motion abnormalities. Doppler parameters are consistent with abnormal left ventricular relaxation (grade 1 diastolic dysfunction). - Aortic valve: There was no stenosis. - Aorta: Ascending aortic diameter: 38 mm (S). - Ascending aorta: The ascending aorta was mildly dilated. - Mitral valve: There was no significant regurgitation. - Right ventricle: Poorly visualized. The cavity size was normal. Systolic function was normal. - Pulmonary arteries: No complete TR doppler jet so unable to estimate PA systolic pressure. - Systemic veins: IVC measured 2.5 cm with > 50% respirophasic variation, suggesting RA pressure 8 mmHg.  Impressions:  - Normal LV size with mild LV hypertrophy. EF 60-65%. Normal RV size and systolic function. No significant  valvular abnormalities.    Cardiac catheterization 05/23/2015 Conclusion     2nd RPLB lesion, 100% stenosed.  There is mild left ventricular systolic dysfunction.  Prox RCA to Mid RCA lesion, 95% stenosed. There is a 0% residual stenosis post intervention.  A drug-eluting stent was placed.  1. Single vessel occlusive CAD 2. Mild LV dysfunction 3. Successful stenting of the proximal RCA with DES.   Plan: DAPT for one year. Risk factor modification. There is still significant ST elevation on Ecg felt to be related to residual PL2 occlusion. The patient is pain free. He may be a candidate for fast track DC.       Hospital Course  The patient is an 69 year old Caucasian male with past medical history of hypertension and obstructive sleep apnea on CPAP. He never smoked, does not drink and does not have any history of illicit drug use. He denies significant family history of CAD. He presented to [facility] on 05/23/2015 as inferolateral STEMI. The chest pain started around 1 PM while taking a shower, discomfort radiating down the left arm. He also endorsed diaphoresis. EKG was consistent with inferolateral STEMI, he was taken emergently to the cath lab for artery catheterization.  Cardiac catheterization on 05/23/2015 showed 100% stenosed 2nd RPLB lesion (Posterolateral branch embolus with persistent occlusion at completion of the case), 85% prox to mid RCA lesion treated with DES. Post cath, patient was transferred to the cardiac ICU. Despite intervention, there was still significant ST elevation post cath which was felt to be related to occluded PL2 but patient was no longer having chest pain. His troponin trended up to 21.59. EKG abnormalities eventually improved. Significant laboratory finding also included hemoglobin A1c of 6.0. Lipid panel showed LDL 120, HDL 35. Patient was started on high-dose statin along with beta blocker on top of home losartan. Other significant laboratory  finding include hemoglobin of 19.1. Given the thrombotic nature  of his coronary blockages, elevated hemoglobin recent concern for polycythemia. Echocardiogram obtained on 05/24/2015 showed EF 71-02%, grade 1 diastolic dysfunction, ascending aortic diameter 2mmHg, no significant valvular abnormalities.   Patient was seen in the morning of 05/25/2015 at which time he denies any significant chest discomfort or shortness breath. He has been ambulated with cardiac rehabilitation without significant discomfort. His right radial cath is stable without significant bleeding or hematoma. He is deemed stable for discharge from cardiology perspective. I have placed repeated emphasis on compliance with aspirin and Brilinta. I have also updated the patient on his elevated hemoglobin, he will need to continue to follow-up with PCP who can refer him to hematology as outpatient for further workup and rule out polycythemia vera.   Discharge Vitals Blood pressure 126/74, pulse 73, temperature 98.1 F (36.7 C), temperature source Oral, resp. rate 20, height 5\' 10"  (1.778 m), weight 308 lb 3.3 oz (139.8 kg), SpO2 95 %.  Filed Weights   05/23/15 1800 05/24/15 0401 05/25/15 0438  Weight: 307 lb 8.7 oz (139.5 kg) 142 lb 13.7 oz (64.8 kg) 308 lb 3.3 oz (139.8 kg)    Labs  CBC  Recent Labs  05/23/15 1616 05/23/15 1647 05/24/15 0525  WBC 16.6*  --  18.0*  HGB 19.1* 19.4* 17.4*  HCT 54.9* 57.0* 50.1  MCV 95.8  --  95.6  PLT 187  --  637   Basic Metabolic Panel  Recent Labs  05/23/15 1616 05/23/15 1647 05/24/15 0525  NA 137 140 136  K 4.5 4.1 4.1  CL 105 105 103  CO2 20*  --  24  GLUCOSE 153* 146* 171*  BUN 13 16 12   CREATININE 1.11 1.00 1.13  CALCIUM 9.2  --  8.8*   Liver Function Tests  Recent Labs  05/23/15 1620  AST 69*  ALT 78*  ALKPHOS 92  BILITOT 0.8  PROT 7.1  ALBUMIN 3.7   Cardiac Enzymes  Recent Labs  05/23/15 1620 05/23/15 1831 05/23/15 2322 05/24/15 0525  CKTOTAL 213   --   --   --   CKMB 13.9*  --   --   --   TROPONINI 0.32* 2.99* 16.52* 21.59*   Hemoglobin A1C  Recent Labs  05/24/15 0525  HGBA1C 6.0*   Fasting Lipid Panel  Recent Labs  05/24/15 0525  CHOL 173  HDL 35*  LDLCALC 120*  TRIG 92  CHOLHDL 4.9    Disposition  Pt is being discharged home today in good condition.  Follow-up Plans & Appointments      Follow-up Information    Follow up with Murray Hodgkins, NP On 06/02/2015.   Specialties:  Nurse Practitioner, Cardiology, Radiology   Why:  8:00am. Cardiology followup   Contact information:   [address] [place] [address] [phone]       Follow up with Gara Kroner, MD.   Specialty:  Family Medicine   Why:  Please schedule a followup with your primary care physician as soon as possible and discuss the need for hematology referral to rule out polycythemia vera   Contact information:   [place] [address] [phone]       Discharge Medications    Medication List    TAKE these medications        aspirin 81 MG EC tablet  Take 1 tablet (81 mg total) by mouth daily.     atorvastatin 80 MG tablet  Commonly known as:  LIPITOR  Take 1 tablet (80 mg total) by mouth daily at 6 PM.     losartan 100 MG tablet  Commonly known as:  COZAAR  Take 100 mg by mouth daily.     metoprolol succinate 25 MG 24 hr tablet  Commonly known as:  TOPROL-XL  Take 1 tablet (25 mg total) by mouth daily.     nitroGLYCERIN 0.4 MG SL tablet  Commonly known as:  NITROSTAT  Place 1 tablet (0.4 mg total) under the tongue every 5 (five) minutes x 3 doses as needed for chest pain.     ticagrelor 90 MG Tabs tablet  Commonly known as:  BRILINTA  Take 1 tablet (90 mg total) by mouth 2 (two) times daily.         Duration of Discharge Encounter   Greater than 30 minutes including physician time.  Hilbert Corrigan PA-C Pager: [phone] 05/25/2015, 11:52 AM

## 2015-05-25 NOTE — Discharge Instructions (Signed)
No driving for 24 hours. No lifting over 5 lbs for 1 week. No sexual activity for 1 week. Keep procedure site clean & dry. If you notice increased pain, swelling, bleeding or pus, call/return!  You may shower, but no soaking baths/hot tubs/pools for 1 week.  ° ° °

## 2015-05-25 NOTE — Progress Notes (Signed)
Patient Name: Kevin Best Date of Encounter: 05/25/2015  Primary cardiologist: Dr. [name]   Principal Problem:   ST elevation myocardial infarction (STEMI) of inferior wall Active Problems:   Obstructive sleep apnea   HTN (hypertension)   Type 2 diabetes mellitus with other circulatory complications   Morbid obesity   Hyperlipidemia    SUBJECTIVE  Denies any CP or SOB.   CURRENT MEDS . aspirin EC  81 mg Oral Daily  . atorvastatin  80 mg Oral q1800  . heparin  5,000 Units Subcutaneous 3 times per day  . losartan  100 mg Oral Daily  . metoprolol succinate  25 mg Oral Daily  . ticagrelor  90 mg Oral BID    OBJECTIVE  Filed Vitals:   05/24/15 1817 05/24/15 2218 05/25/15 0438 05/25/15 0536  BP: 151/85 148/67  126/74  Pulse: 65 63  73  Temp: 98.1 F (36.7 C) 98.6 F (37 C)  98.1 F (36.7 C)  TempSrc: Oral Oral  Oral  Resp:  18  20  Height:      Weight:   308 lb 3.3 oz (139.8 kg)   SpO2: 98% 98%  95%    Intake/Output Summary (Last 24 hours) at 05/25/15 0901 Last data filed at 05/25/15 0437  Gross per 24 hour  Intake    723 ml  Output   2975 ml  Net  -2252 ml   Filed Weights   05/23/15 1800 05/24/15 0401 05/25/15 0438  Weight: 307 lb 8.7 oz (139.5 kg) 142 lb 13.7 oz (64.8 kg) 308 lb 3.3 oz (139.8 kg)    PHYSICAL EXAM  General: Pleasant, NAD. Neuro: Alert and oriented X 3. Moves all extremities spontaneously. Psych: Normal affect. HEENT:  Normal  Neck: Supple without bruits or JVD. Lungs:  Resp regular and unlabored, CTA. Heart: RRR no s3, s4, or murmurs. R radial cath site stable, no significant bleeding or hematoma  Abdomen: Soft, non-tender, non-distended, BS + x 4.  Extremities: No clubbing, cyanosis or edema. DP/PT/Radials 2+ and equal bilaterally.  Accessory Clinical Findings  CBC  Recent Labs  05/23/15 1616 05/23/15 1647 05/24/15 0525  WBC 16.6*  --  18.0*  HGB 19.1* 19.4* 17.4*  HCT 54.9* 57.0* 50.1  MCV 95.8  --  95.6  PLT 187  --   759   Basic Metabolic Panel  Recent Labs  05/23/15 1616 05/23/15 1647 05/24/15 0525  NA 137 140 136  K 4.5 4.1 4.1  CL 105 105 103  CO2 20*  --  24  GLUCOSE 153* 146* 171*  BUN 13 16 12   CREATININE 1.11 1.00 1.13  CALCIUM 9.2  --  8.8*   Liver Function Tests  Recent Labs  05/23/15 1620  AST 69*  ALT 78*  ALKPHOS 92  BILITOT 0.8  PROT 7.1  ALBUMIN 3.7   Cardiac Enzymes  Recent Labs  05/23/15 1620 05/23/15 1831 05/23/15 2322 05/24/15 0525  CKTOTAL 213  --   --   --   CKMB 13.9*  --   --   --   TROPONINI 0.32* 2.99* 16.52* 21.59*   Hemoglobin A1C  Recent Labs  05/24/15 0525  HGBA1C 6.0*   Fasting Lipid Panel  Recent Labs  05/24/15 0525  CHOL 173  HDL 35*  LDLCALC 120*  TRIG 92  CHOLHDL 4.9    TELE NSR with HR 50-80s    ECG  No new EKG  Echocardiogram 05/24/2015  LV EF: 60% -  65%  -------------------------------------------------------------------  Indications:   CAD of native vessels 414.01.  ------------------------------------------------------------------- History:  PMH:  Myocardial infarction. Risk factors: Hypertension.  ------------------------------------------------------------------- Study Conclusions  - Left ventricle: The cavity size was normal. Wall thickness was increased in a pattern of mild LVH. Systolic function was normal. The estimated ejection fraction was in the range of 60% to 65%. Wall motion was normal; there were no regional wall motion abnormalities. Doppler parameters are consistent with abnormal left ventricular relaxation (grade 1 diastolic dysfunction). - Aortic valve: There was no stenosis. - Aorta: Ascending aortic diameter: 38 mm (S). - Ascending aorta: The ascending aorta was mildly dilated. - Mitral valve: There was no significant regurgitation. - Right ventricle: Poorly visualized. The cavity size was normal. Systolic function was normal. - Pulmonary arteries: No complete  TR doppler jet so unable to estimate PA systolic pressure. - Systemic veins: IVC measured 2.5 cm with > 50% respirophasic variation, suggesting RA pressure 8 mmHg.  Impressions:  - Normal LV size with mild LV hypertrophy. EF 60-65%. Normal RV size and systolic function. No significant valvular abnormalities.    Radiology/Studies  Dg Chest Port 1 View  05/23/2015   CLINICAL DATA:  Chest pain after cardiac catheterization  EXAM: PORTABLE CHEST - 1 VIEW  COMPARISON:  03/22/2004  FINDINGS: Cardiac silhouette enlargement of moderate severity, stable. Vascular pattern normal. Lungs clear although right costophrenic angle is excluded from the image.  IMPRESSION: Study limited by exclusion of a portion of the inferolateral right lung. Otherwise no acute findings.   Electronically Signed   By: Skipper Cliche M.D.   On: 05/23/2015 18:06    ASSESSMENT AND PLAN  1. Inferolateral STEMI  - Cath 05/23/2015 100% stenosed 2nd RPLB lesion (Posterolateral branch embolus with persistent occlusion at completion of the case), 85% prox to mid RCA lesion treated with DES.    - Echo 05/24/2015 EF 86-76%, grade 1 diastolic dysfunction, ascending aortic diameter 68mmHg, no significant valvular abnormalities.   - Continue ASA, lipitor, losartan, metoprolol and brilinta.   - stable for discharge, will arrange 7 day transition of care followup. Need outpatient consult with hematology  2. HTN  3. Hyperlipidemia  4. Prediabetes: seen by diabetes coordinator, need continuous followup with PCP   5. Polycythemia: per Dr. Tamala Julian, will need hematology consult to rule out polycythemia vera   6. OSA on CPAP  7. Morbid obesity  Signed, Almyra Deforest PA-C Pager: [phone]

## 2015-05-25 NOTE — Progress Notes (Signed)
CARDIAC REHAB PHASE I   PRE:  Rate/Rhythm: 60 SR  BP:  Sitting: 144/74        SaO2: 96 RA  MODE:  Ambulation: 1000 ft   POST:  Rate/Rhythm: 90 SR  BP:  Sitting: 160/73         SaO2: 99 RA   Pt in bed, eager to go home, states he walked to the solarium and back this morning with no difficulty. Pt ambulated 1000 ft on RA, independent, brisk, steady gait, tolerated well.  Pt denies DOE, cp, dizziness, declined rest stop. Completed MI/stent education.  Reviewed heart healthy diet, carb counting, portion control, and sodium restrictions. Pt verbalized understanding. Pt to side of bed after walk, call bell within reach.    [phone]  Lenna Sciara, RN, BSN 05/25/2015 9:52 AM

## 2015-05-25 NOTE — Care Management Note (Signed)
dCase Management Note  Patient Details  Name: Kevin Best MRN: [id number] Date of Birth: 1946-06-25  Subjective/Objective: Pt plan for d/c today. CM did call [facility]-Brilinta is available. Co pay is 325.86.                    Action/Plan: Pt is aware of cost. CM did go over the 30 day free card with pt and wife. Brilinta support number given to pt in order to get assistance with medication. Pt aware to call office if unable to pay amount to see if samples available and if needs to fill out patient assistance form. No further needs from CM at this time.    Expected Discharge Date:                  Expected Discharge Plan:  Home/Self Care  In-House Referral:     Discharge planning Services  CM Consult, Medication Assistance  Post Acute Care Choice:    Choice offered to:     DME Arranged:    DME Agency:     HH Arranged:    [place] Agency:     Status of Service:     Medicare Important Message Given:    Date Medicare IM Given:    Medicare IM give by:    Date Additional Medicare IM Given:    Additional Medicare Important Message give by:     If discussed at [place] of Stay Meetings, dates discussed:    Additional Comments:  Bethena Roys, RN 05/25/2015, 12:42 PM

## 2015-05-26 NOTE — Telephone Encounter (Signed)
Patient contacted regarding discharge from [facility] on 05/25/15.  Patient understands to follow up with provider Cecilie Kicks, NP on 06/01/15 at 8:00a at [facility]. Address provided to patient. Patient understands discharge instructions? yes Patient understands medications and regiment? yes Patient understands to bring all medications to this visit? yes

## 2015-05-29 DIAGNOSIS — Z125 Encounter for screening for malignant neoplasm of prostate: Secondary | ICD-10-CM | POA: Diagnosis not present

## 2015-05-29 DIAGNOSIS — R312 Other microscopic hematuria: Secondary | ICD-10-CM | POA: Diagnosis not present

## 2015-06-01 ENCOUNTER — Encounter: Payer: Self-pay | Admitting: Cardiology

## 2015-06-01 ENCOUNTER — Ambulatory Visit (INDEPENDENT_AMBULATORY_CARE_PROVIDER_SITE_OTHER): Payer: Medicare Other | Admitting: Cardiology

## 2015-06-01 VITALS — BP 128/72 | HR 70 | Ht 70.5 in | Wt 304.6 lb

## 2015-06-01 DIAGNOSIS — I10 Essential (primary) hypertension: Secondary | ICD-10-CM | POA: Diagnosis not present

## 2015-06-01 DIAGNOSIS — I2510 Atherosclerotic heart disease of native coronary artery without angina pectoris: Secondary | ICD-10-CM

## 2015-06-01 DIAGNOSIS — E785 Hyperlipidemia, unspecified: Secondary | ICD-10-CM

## 2015-06-01 DIAGNOSIS — I2111 ST elevation (STEMI) myocardial infarction involving right coronary artery: Secondary | ICD-10-CM

## 2015-06-01 DIAGNOSIS — R7303 Prediabetes: Secondary | ICD-10-CM

## 2015-06-01 DIAGNOSIS — R7309 Other abnormal glucose: Secondary | ICD-10-CM

## 2015-06-01 NOTE — Patient Instructions (Addendum)
Medication Instructions:  Your physician recommends that you continue on your current medications as directed. Please refer to the Current Medication list given to you today. Do not stop Aspirin or Brilinta.   Labwork: Your physician recommends that you return for FASTING lab work in: [place] @ The same time you see Dr. [name] at [facility]   Testing/Procedures: None ordered  Follow-Up: Your physician recommends that you schedule a follow-up appointment in: 6 WEEKS WITH Dr. [name]   Any Other Special Instructions Will Be Listed Below (If Applicable). OK for pt to start Cardiac Rehab.

## 2015-06-01 NOTE — Progress Notes (Signed)
Cardiology Office Note   Date:  06/01/2015   ID:  Kevin Best, DOB 03/29/1946, MRN [id number]  PCP:  Default, Provider, MD  Cardiologist:  Dr. [name]    Chief Complaint  Patient presents with  . Hospitalization Follow-up    STEMI inf wall s/p stent      History of Present Illness: Kevin Best is a 69 y.o. male who presents for follow up of his STEMI on 05/23/15. His past medical history of hypertension and obstructive sleep apnea on CPAP who did not have prior h/o CAD presented with inferolateral STEMI.  Emergent cath lab and DES to RCA prox to mid.     Pt did well post procedure. His HgBA1C was elevated at 12 but glucose did not reflect these numbers and repeat was 6.    Today no chest pain, DOE at first but resolved after walking for a day or so.  Now resolved.  Has been monitoring diet and weight is down 10 lbs on his scales.  Congratulated.     Past Medical History  Diagnosis Date  . Environmental allergies   . Sleep apnea   . ALLERGIC RHINITIS   . Hypertension   . Prediabetes 05/25/2015  . Hyperlipidemia 05/24/2015  . CAD (coronary artery disease)     s/p inferolateral STEMI 05/23/2015 DES to prox-mid RCA, 100% occluded RPLB 2    Past Surgical History  Procedure Laterality Date  . Splenectomy, total  2005  . Throat surgery    . Uvulopalatopharyngoplasty    . Cardiac catheterization N/A 05/23/2015    Procedure: Left Heart Cath and Coronary Angiography;  Surgeon: Peter [name], MD;  Location: [facility];  Service: Cardiovascular;  Laterality: N/A;  . Cardiac catheterization  05/23/2015    Procedure: Coronary Stent Intervention;  Surgeon: Peter [name], MD;  Location: [facility];  Service: Cardiovascular;;     Current Outpatient Prescriptions  Medication Sig Dispense Refill  . aspirin EC 81 MG EC tablet Take 1 tablet (81 mg total) by mouth daily.    Marland Kitchen atorvastatin (LIPITOR) 80 MG tablet Take 1 tablet (80 mg total) by mouth daily at 6 PM. 90  tablet 3  . losartan (COZAAR) 100 MG tablet Take 100 mg by mouth daily.    . metoprolol succinate (TOPROL-XL) 25 MG 24 hr tablet Take 1 tablet (25 mg total) by mouth daily. 90 tablet 3  . nitroGLYCERIN (NITROSTAT) 0.4 MG SL tablet Place 1 tablet (0.4 mg total) under the tongue every 5 (five) minutes x 3 doses as needed for chest pain. 25 tablet 3  . ticagrelor (BRILINTA) 90 MG TABS tablet Take 1 tablet (90 mg total) by mouth 2 (two) times daily. 60 tablet 11   No current facility-administered medications for this visit.    Allergies:   Review of patient's allergies indicates no known allergies.    Social History:  The patient  reports that he has never smoked. He does not have any smokeless tobacco history on file. He reports that he does not drink alcohol or use illicit drugs.   Family History:  The patient's family history includes Melanoma in his father.    ROS:  General:no colds or fevers, + weight loss  Skin:no rashes or ulcers HEENT:no blurred vision, no congestion CV:see HPI PUL:see HPI GI:no diarrhea constipation or melena, no indigestion GU:no hematuria, no dysuria- for GU workup in nest week.   MS:no joint pain, no claudication Neuro:no syncope, no lightheadedness Endo:borderline  diabetes, no thyroid disease  Wt Readings from Last 3 Encounters:  06/01/15 304 lb 9.6 oz (138.166 kg)  05/25/15 308 lb 3.3 oz (139.8 kg)  03/20/11 310 lb 3.2 oz (140.706 kg)     PHYSICAL EXAM: VS:  BP 128/72 mmHg  Pulse 70  Ht 5' 10.5" (1.791 m)  Wt 304 lb 9.6 oz (138.166 kg)  BMI 43.07 kg/m2 , BMI Body mass index is 43.07 kg/(m^2). General:Pleasant affect, NAD Skin:Warm and dry, brisk capillary refill HEENT:normocephalic, sclera clear, mucus membranes moist Neck:supple, no JVD, no bruits  Heart:S1S2 RRR without murmur, gallup, rub or click Lungs:clear- diminished without rales, rhonchi, or wheezes CZY:SAYTK, soft, non tender, + BS, do not palpate liver spleen or masses Ext:no lower  ext edema, 2+ pedal pulses, 2+ radial pulses- cath site healed. Neuro:alert and oriented, MAE, follows commands, + facial symmetry    EKG:  EKG is ordered today. The ekg ordered today demonstrates SR with mild ST elevation in II,III, AVF improved from discharge.    Recent Labs: 05/23/2015: ALT 78* 05/24/2015: BUN 12; Creatinine, Ser 1.13; Hemoglobin 17.4*; Platelets 197; Potassium 4.1; Sodium 136    Lipid Panel    Component Value Date/Time   CHOL 173 05/24/2015 0525   TRIG 92 05/24/2015 0525   HDL 35* 05/24/2015 0525   CHOLHDL 4.9 05/24/2015 0525   VLDL 18 05/24/2015 0525   LDLCALC 120* 05/24/2015 0525       Other studies Reviewed: Additional studies/ records that were reviewed today include:   ECHO:. Study Conclusions  - Left ventricle: The cavity size was normal. Wall thickness was increased in a pattern of mild LVH. Systolic function was normal. The estimated ejection fraction was in the range of 60% to 65%. Wall motion was normal; there were no regional wall motion abnormalities. Doppler parameters are consistent with abnormal left ventricular relaxation (grade 1 diastolic dysfunction). - Aortic valve: There was no stenosis. - Aorta: Ascending aortic diameter: 38 mm (S). - Ascending aorta: The ascending aorta was mildly dilated. - Mitral valve: There was no significant regurgitation. - Right ventricle: Poorly visualized. The cavity size was normal. Systolic function was normal. - Pulmonary arteries: No complete TR doppler jet so unable to estimate PA systolic pressure. - Systemic veins: IVC measured 2.5 cm with > 50% respirophasic variation, suggesting RA pressure 8 mmHg.  Impressions:  - Normal LV size with mild LV hypertrophy. EF 60-65%. Normal RV size and systolic function. No significant valvular abnormalities.  CARDIAC CATH:  2nd RPLB lesion, 100% stenosed.  There is mild left ventricular systolic dysfunction.  Prox RCA to Mid  RCA lesion, 95% stenosed. There is a 0% residual stenosis post intervention.  A drug-eluting stent was placed.  1. Single vessel occlusive CAD 2. Mild LV dysfunction 3. Successful stenting of the proximal RCA with DES.   Plan: DAPT for one year. Risk factor modification. There is still significant ST elevation on Ecg felt to be related to residual PL2 occlusion. The patient is pain free. He may be a candidate for fast track DC.      Coronary Findings    Dominance: Right   Left Main  The vessel is normal in caliber is angiographically normal.     Left Anterior Descending  The vessel is normal in caliber . The vessel exhibits minimal luminal irregularities.     Left Circumflex  The vessel is normal in caliber . The vessel exhibits minimal luminal irregularities.     Right Coronary Artery   .  Prox RCA to Mid RCA lesion, 95% stenosed. thrombotic .   Marland Kitchen PCI: The pre-interventional distal flow is decreased (TIMI 2). Pre-stent angioplasty was performed. 2.5 mm balloon A drug-eluting stent was placed. Post-stent angioplasty was performed. 4.5 mm [place] balloon Maximum pressure: 14 atm. The post-interventional distal flow is normal (TIMI 3). The intervention was successful. No complications occurred at this lesion. Prior to PCI there was distal embolization with occlusion of the second PL branch. this was not treated due to small vessel with extensive thrombus.  . Supplies used: STENT SYNERGY DES [id number]  . There is no residual stenosis post intervention.     . Second Right Posterolateral   . 2nd RPLB lesion, 100% stenosed. thrombotic .       ASSESSMENT AND PLAN:  1.  ST elevation myocardial infarction (STEMI) of inferior wall- DES  A Synergy to pRCA to [facility] for 95% stenosis.  Plan DAPT for 1 year. Troponin Pk at 21.59   2. CAD- 2nd RPLB 100% stenosed-persistent occl at end of case. .    3. Obstructive sleep apnea 4.  HTN (hypertension)- controlled 5. Morbid obesity- discussed  cardiac rehab and wt. Loss with diet modifications. 6.  Hyperlipidemia  On statin- ne check lipid and hepatic panel in 6 weeks.   7.  Prediabetes Hgb A1C 6.0 instructed to monitor diet and follow up with PCP.  8. HGB elevated 19-17- also WBC elevated follow up with PCP 9. Hematuria for GU eval in future.    Current medicines are reviewed with the patient today.  The patient Has no concerns regarding medicines.  The following changes have been made:  See above Labs/ tests ordered today include:see above  Disposition:   FU:  see above  Signed, Isaiah Serge, NP  06/01/2015 8:19 AM    [facility], [place], [place], [place] Phone: [phone]; Fax: [phone]

## 2015-06-02 ENCOUNTER — Encounter: Payer: Medicare Other | Admitting: Nurse Practitioner

## 2015-06-08 DIAGNOSIS — R7309 Other abnormal glucose: Secondary | ICD-10-CM | POA: Diagnosis not present

## 2015-06-08 DIAGNOSIS — N529 Male erectile dysfunction, unspecified: Secondary | ICD-10-CM | POA: Diagnosis not present

## 2015-06-08 DIAGNOSIS — I252 Old myocardial infarction: Secondary | ICD-10-CM | POA: Diagnosis not present

## 2015-06-08 DIAGNOSIS — D751 Secondary polycythemia: Secondary | ICD-10-CM | POA: Diagnosis not present

## 2015-06-08 DIAGNOSIS — M109 Gout, unspecified: Secondary | ICD-10-CM | POA: Diagnosis not present

## 2015-06-08 DIAGNOSIS — N281 Cyst of kidney, acquired: Secondary | ICD-10-CM | POA: Diagnosis not present

## 2015-06-08 DIAGNOSIS — Z23 Encounter for immunization: Secondary | ICD-10-CM | POA: Diagnosis not present

## 2015-06-08 DIAGNOSIS — I10 Essential (primary) hypertension: Secondary | ICD-10-CM | POA: Diagnosis not present

## 2015-06-08 DIAGNOSIS — G4733 Obstructive sleep apnea (adult) (pediatric): Secondary | ICD-10-CM | POA: Diagnosis not present

## 2015-06-08 DIAGNOSIS — Q8901 Asplenia (congenital): Secondary | ICD-10-CM | POA: Diagnosis not present

## 2015-06-08 DIAGNOSIS — R319 Hematuria, unspecified: Secondary | ICD-10-CM | POA: Diagnosis not present

## 2015-06-08 DIAGNOSIS — R312 Other microscopic hematuria: Secondary | ICD-10-CM | POA: Diagnosis not present

## 2015-06-15 DIAGNOSIS — R312 Other microscopic hematuria: Secondary | ICD-10-CM | POA: Diagnosis not present

## 2015-06-15 DIAGNOSIS — N281 Cyst of kidney, acquired: Secondary | ICD-10-CM | POA: Diagnosis not present

## 2015-07-10 ENCOUNTER — Other Ambulatory Visit: Payer: Self-pay | Admitting: Cardiology

## 2015-07-10 DIAGNOSIS — E785 Hyperlipidemia, unspecified: Secondary | ICD-10-CM | POA: Diagnosis not present

## 2015-07-10 LAB — LIPID PANEL
CHOLESTEROL: 99 mg/dL — AB (ref 125–200)
HDL: 35 mg/dL — ABNORMAL LOW (ref >=40)
LDL CALC: 46 mg/dL (ref <130)
Total CHOL/HDL Ratio: 2.8 Ratio (ref <=5.0)
Triglycerides: 91 mg/dL (ref <150)
VLDL: 18 mg/dL (ref <30)

## 2015-07-10 LAB — HEPATIC FUNCTION PANEL
ALT: 21 U/L (ref 9–46)
AST: 21 U/L (ref 10–35)
Albumin: 3.8 g/dL (ref 3.6–5.1)
Alkaline Phosphatase: 102 U/L (ref 40–115)
BILIRUBIN DIRECT: 0.2 mg/dL (ref <=0.2)
BILIRUBIN INDIRECT: 0.8 mg/dL (ref 0.2–1.2)
BILIRUBIN TOTAL: 1 mg/dL (ref 0.2–1.2)
Total Protein: 6.3 g/dL (ref 6.1–8.1)

## 2015-07-13 ENCOUNTER — Other Ambulatory Visit: Payer: Self-pay | Admitting: Cardiology

## 2015-07-13 ENCOUNTER — Ambulatory Visit (INDEPENDENT_AMBULATORY_CARE_PROVIDER_SITE_OTHER): Payer: Medicare Other | Admitting: Cardiology

## 2015-07-13 ENCOUNTER — Encounter: Payer: Self-pay | Admitting: Cardiology

## 2015-07-13 VITALS — BP 126/76 | HR 62 | Ht 70.0 in | Wt 292.4 lb

## 2015-07-13 DIAGNOSIS — I10 Essential (primary) hypertension: Secondary | ICD-10-CM

## 2015-07-13 DIAGNOSIS — E785 Hyperlipidemia, unspecified: Secondary | ICD-10-CM | POA: Diagnosis not present

## 2015-07-13 DIAGNOSIS — R7303 Prediabetes: Secondary | ICD-10-CM

## 2015-07-13 DIAGNOSIS — I2111 ST elevation (STEMI) myocardial infarction involving right coronary artery: Secondary | ICD-10-CM

## 2015-07-13 MED ORDER — CLOPIDOGREL BISULFATE 75 MG PO TABS: 75.0000 mg | ORAL_TABLET | Freq: Every day | ORAL | Status: DC

## 2015-07-13 NOTE — Telephone Encounter (Signed)
Rx(s) sent to pharmacy electronically.  

## 2015-07-13 NOTE — Patient Instructions (Addendum)
Continue your current therapy except stop Brilinta. We will switch to Plavix- take 300 mg (4 tabs) the first day then 75 mg daily after.  We will get you into cardiac Rehab.  I will see you in 4 months

## 2015-07-13 NOTE — Telephone Encounter (Signed)
Please call the prescription into a a different pharmacy and want to know if the generic for plavix is just as good and the name brand , if so , Please send the generic to [facility] .Marland Kitchen Please call if you have any questions   Thanks

## 2015-07-13 NOTE — Progress Notes (Signed)
Cardiology Office Note   Date:  07/13/2015   ID:  Kevin Best, DOB Dec 10, 1945, MRN [id number]  PCP:  Default, Provider, MD  Cardiologist:  Dr. [name]    Chief Complaint  Patient presents with  . Coronary Artery Disease      History of Present Illness: Kevin Best is a 69 y.o. male who presents for follow up of his STEMI on 05/23/15. His past medical history of hypertension and obstructive sleep apnea on CPAP who did not have prior h/o CAD until he presented with inferolateral STEMI.  Emergent cath lab and DES to RCA prox to mid.     Pt did well post procedure. His HgBA1C was elevated at 12 but glucose did not reflect these numbers and repeat was 6.   On follow up today he denies any chest pain or DOE. Has been monitoring diet and weight is down 32 lbs since August. He is walking up to an hour a day. Never heard from Cardiac Rehab. He notes that he will not be able to afford Briliinta.    Past Medical History  Diagnosis Date  . Environmental allergies   . Sleep apnea   . ALLERGIC RHINITIS   . Hypertension   . Prediabetes 05/25/2015  . Hyperlipidemia 05/24/2015  . CAD (coronary artery disease)     s/p inferolateral STEMI 05/23/2015 DES to prox-mid RCA, 100% occluded RPLB 2    Past Surgical History  Procedure Laterality Date  . Splenectomy, total  2005  . Throat surgery    . Uvulopalatopharyngoplasty    . Cardiac catheterization N/A 05/23/2015    Procedure: Left Heart Cath and Coronary Angiography;  Surgeon:  [name], MD;  Location: [facility];  Service: Cardiovascular;  Laterality: N/A;  . Cardiac catheterization  05/23/2015    Procedure: Coronary Stent Intervention;  Surgeon:  [name], MD;  Location: [facility];  Service: Cardiovascular;;     Current Outpatient Prescriptions  Medication Sig Dispense Refill  . aspirin EC 81 MG EC tablet Take 1 tablet (81 mg total) by mouth daily.    Marland Kitchen atorvastatin (LIPITOR) 80 MG tablet Take 1 tablet (80 mg  total) by mouth daily at 6 PM. 90 tablet 3  . losartan (COZAAR) 100 MG tablet Take 100 mg by mouth daily.    . metoprolol succinate (TOPROL-XL) 25 MG 24 hr tablet Take 1 tablet (25 mg total) by mouth daily. 90 tablet 3  . nitroGLYCERIN (NITROSTAT) 0.4 MG SL tablet Place 1 tablet (0.4 mg total) under the tongue every 5 (five) minutes x 3 doses as needed for chest pain. 25 tablet 3  . clopidogrel (PLAVIX) 75 MG tablet Take 1 tablet (75 mg total) by mouth daily. 360 tablet 0   No current facility-administered medications for this visit.    Allergies:   Review of patient's allergies indicates no known allergies.    Social History:  The patient  reports that he has never smoked. He does not have any smokeless tobacco history on file. He reports that he does not drink alcohol or use illicit drugs.   Family History:  The patient's family history includes Melanoma in his father.    ROS:  General:no colds or fevers, + weight loss  Skin:no rashes or ulcers HEENT:no blurred vision, no congestion CV:see HPI PUL:see HPI GI:no diarrhea constipation or melena, no indigestion GU:no hematuria, no dysuria- for GU workup in nest week.   MS:no joint pain, no claudication Neuro:no syncope, no  lightheadedness Endo:borderline diabetes, no thyroid disease  Wt Readings from Last 3 Encounters:  07/13/15 132.62 kg (292 lb 6 oz)  06/01/15 138.166 kg (304 lb 9.6 oz)  05/25/15 139.8 kg (308 lb 3.3 oz)     PHYSICAL EXAM: VS:  BP 126/76 mmHg  Pulse 62  Ht 5' 10"  (1.778 m)  Wt 132.62 kg (292 lb 6 oz)  BMI 41.95 kg/m2 , BMI Body mass index is 41.95 kg/(m^2). General:Pleasant affect, NAD Skin:Warm and dry, brisk capillary refill HEENT:normocephalic, sclera clear, mucus membranes moist Neck:supple, no JVD, no bruits  Heart:S1S2 RRR without murmur, gallup, rub or click Lungs:clear- diminished without rales, rhonchi, or wheezes UYQ:IHKVQ, soft, non tender, + BS, do not palpate liver spleen or masses Ext:no  lower ext edema, 2+ pedal pulses, 2+ radial pulses- cath site healed. Neuro:alert and oriented, MAE, follows commands, + facial symmetry    EKG:  EKG is not ordered today.    Recent Labs: 05/24/2015: BUN 12; Creatinine, Ser 1.13; Hemoglobin 17.4*; Platelets 197; Potassium 4.1; Sodium 136 07/10/2015: ALT 21    Lipid Panel    Component Value Date/Time   CHOL 99* 07/10/2015 1015   TRIG 91 07/10/2015 1015   HDL 35* 07/10/2015 1015   CHOLHDL 2.8 07/10/2015 1015   VLDL 18 07/10/2015 1015   LDLCALC 46 07/10/2015 1015       Other studies Reviewed: Additional studies/ records that were reviewed today include:   ECHO:. Study Conclusions  - Left ventricle: The cavity size was normal. Wall thickness was increased in a pattern of mild LVH. Systolic function was normal. The estimated ejection fraction was in the range of 60% to 65%. Wall motion was normal; there were no regional wall motion abnormalities. Doppler parameters are consistent with abnormal left ventricular relaxation (grade 1 diastolic dysfunction). - Aortic valve: There was no stenosis. - Aorta: Ascending aortic diameter: 38 mm (S). - Ascending aorta: The ascending aorta was mildly dilated. - Mitral valve: There was no significant regurgitation. - Right ventricle: Poorly visualized. The cavity size was normal. Systolic function was normal. - Pulmonary arteries: No complete TR doppler jet so unable to estimate PA systolic pressure. - Systemic veins: IVC measured 2.5 cm with > 50% respirophasic variation, suggesting RA pressure 8 mmHg.  Impressions:  - Normal LV size with mild LV hypertrophy. EF 60-65%. Normal RV size and systolic function. No significant valvular abnormalities.  CARDIAC CATH:  2nd RPLB lesion, 100% stenosed.  There is mild left ventricular systolic dysfunction.  Prox RCA to Mid RCA lesion, 95% stenosed. There is a 0% residual stenosis post intervention.  A drug-eluting  stent was placed.  1. Single vessel occlusive CAD 2. Mild LV dysfunction 3. Successful stenting of the proximal RCA with DES.   Plan: DAPT for one year. Risk factor modification. There is still significant ST elevation on Ecg felt to be related to residual PL2 occlusion. The patient is pain free. He may be a candidate for fast track DC.      Coronary Findings    Dominance: Right   Left Main  The vessel is normal in caliber is angiographically normal.     Left Anterior Descending  The vessel is normal in caliber . The vessel exhibits minimal luminal irregularities.     Left Circumflex  The vessel is normal in caliber . The vessel exhibits minimal luminal irregularities.     Right Coronary Artery   . Prox RCA to Mid RCA lesion, 95% stenosed. thrombotic .   Marland Kitchen  PCI: The pre-interventional distal flow is decreased (TIMI 2). Pre-stent angioplasty was performed. 2.5 mm balloon A drug-eluting stent was placed. Post-stent angioplasty was performed. 4.5 mm [place] balloon Maximum pressure: 14 atm. The post-interventional distal flow is normal (TIMI 3). The intervention was successful. No complications occurred at this lesion. Prior to PCI there was distal embolization with occlusion of the second PL branch. this was not treated due to small vessel with extensive thrombus.  . Supplies used: STENT SYNERGY DES [id number]  . There is no residual stenosis post intervention.     . Second Right Posterolateral   . 2nd RPLB lesion, 100% stenosed. thrombotic .       ASSESSMENT AND PLAN:  1.  ST elevation myocardial infarction (STEMI) of inferior wall- DES  A Synergy to pRCA to [facility] for 95% stenosis. This was a large vessel.  Plan DAPT for 1 year. Troponin Pk at 21.59. Will switch Brilinta to Plavix due to cost. Will arrange follow up with cardiac Rehab.   2. CAD- 2nd RPLB 100% stenosed-persistent occl at end of case.  3. Obstructive sleep apnea 4.  HTN (hypertension)- controlled 5. Morbid obesity-  excellent weight loss. Encouraged to continue.  6.  Hyperlipidemia  Excellent control on high dose statin. Recommend continuing current dose for one year. If lipids continue this low may consider dropping dose at that time.  7.  Prediabetes Hgb A1C 6.0 instructed to monitor diet and follow up with PCP 8. HGB elevated 19-17- also WBC elevated follow up with PCP   Current medicines are reviewed with the patient today.  The patient Has no concerns regarding medicines.  The following changes have been made:  See above Labs/ tests ordered today include:see above  Disposition:   FU:  4 months.   Signed,  [name], MD  07/13/2015 8:01 PM    [facility], [place], [place] Phone: [phone]; Fax: [phone]

## 2015-08-10 ENCOUNTER — Encounter (HOSPITAL_COMMUNITY)
Admission: RE | Admit: 2015-08-10 | Discharge: 2015-08-10 | Disposition: A | Discharge status: Home / Self Care | Payer: Medicare Other | Source: Ambulatory Visit | Attending: Cardiology | Admitting: Cardiology

## 2015-08-10 DIAGNOSIS — Z955 Presence of coronary angioplasty implant and graft: Secondary | ICD-10-CM | POA: Insufficient documentation

## 2015-08-10 DIAGNOSIS — I213 ST elevation (STEMI) myocardial infarction of unspecified site: Secondary | ICD-10-CM | POA: Insufficient documentation

## 2015-08-10 NOTE — Progress Notes (Signed)
Cardiac Rehab Medication Review by a Pharmacist  Does the patient  feel that his/her medications are working for him/her?  yes  Has the patient been experiencing any side effects to the medications prescribed?  yes  Does the patient measure his/her own blood pressure or blood glucose at home?  no   Does the patient have any problems obtaining medications due to transportation or finances?   no  Understanding of regimen: good Understanding of indications: good Potential of compliance: excellent    Pharmacist comments: Spoke with patient and his wife who states that the patient has been experience some nerve pain they they are relating to his current statin. I explained that the pain associated with statin therapy is more of a muscle pain as opposed to nerve pain and suggested that the patient visit their PCP in order to determine the pain source. I also spoke with the patient ad his wife about his current therapy of ticagrelor. They stated that the cardiologist has given them a new  Prescription for plavix and therefore they are contemplating switching to plavix from ticagrelor. I coulseled them that if they decide to switch to make sure that they discontinue the ticagrelor. They did not have any further medication therapy questions.    C. Lennox Grumbles, PharmD Pharmacy Resident  Pager: [phone] 08/10/2015 9:23 AM

## 2015-08-14 ENCOUNTER — Telehealth: Payer: Self-pay | Admitting: Cardiology

## 2015-08-14 ENCOUNTER — Encounter (HOSPITAL_COMMUNITY)
Admission: RE | Admit: 2015-08-14 | Discharge: 2015-08-14 | Disposition: A | Discharge status: Home / Self Care | Payer: Medicare Other | Source: Ambulatory Visit | Attending: Cardiology | Admitting: Cardiology

## 2015-08-14 DIAGNOSIS — I213 ST elevation (STEMI) myocardial infarction of unspecified site: Secondary | ICD-10-CM | POA: Diagnosis not present

## 2015-08-14 DIAGNOSIS — Z955 Presence of coronary angioplasty implant and graft: Secondary | ICD-10-CM | POA: Diagnosis not present

## 2015-08-14 LAB — GLUCOSE, CAPILLARY
Glucose-Capillary: 126 mg/dL — ABNORMAL HIGH (ref 65–99)
Glucose-Capillary: 102 mg/dL — ABNORMAL HIGH (ref 65–99)

## 2015-08-14 NOTE — Telephone Encounter (Signed)
Please call Verdis Frederickson at [facility] will be there until 11:00 am.  He has a question about his statin.

## 2015-08-14 NOTE — Telephone Encounter (Signed)
Returned call to [place] at cardiac rehab.She stated patient complaining lipitor causing pain in legs,knee and back pain.Advised to stop lipitor.I will speak to Dr.Jordan 08/15/15 and call pt back.

## 2015-08-14 NOTE — Progress Notes (Signed)
Pt started cardiac rehab today.  Pt tolerated light exercise without difficulty. VSS, telemetry-Sinus rhtyhm, asymptomatic.  Medication list reconciled.  Kevin Best  Reports that he has been experiencing muscle aches in his lower back and left knee since he has been taking Lipitor in August.  Kevin Best said he started taking a half a tablet on Friday. Kevin Best Dr Doug Sou nurse called and notified. Kevin Best was given instructions over the phone . PSYCHOSOCIAL ASSESSMENT:  PHQ-0. Pt exhibits positive coping skills, hopeful outlook with supportive family. No psychosocial needs identified at this time, no psychosocial interventions necessary.    Pt enjoys working on his computer and playing checkers.   Pt cardiac rehab  goal is  to get into an exercise routine.  Pt encouraged to participate in exercising on your own  to increase ability to achieve these goals.   Pt long term cardiac rehab goal is to have better health and less medications.  Pt oriented to exercise equipment and routine.  Understanding verbalized.

## 2015-08-15 NOTE — Telephone Encounter (Signed)
Returned call to patient.Spoke to [place] about leg,knee and back pain since taking lipitor.Dr.Jordan advised ok to stop.Advised call me back in a week and let me know if pain better.

## 2015-08-16 ENCOUNTER — Encounter (HOSPITAL_COMMUNITY)
Admission: RE | Admit: 2015-08-16 | Discharge: 2015-08-16 | Disposition: A | Discharge status: Home / Self Care | Payer: Medicare Other | Source: Ambulatory Visit | Attending: Cardiology | Admitting: Cardiology

## 2015-08-16 NOTE — Progress Notes (Signed)
Kevin Best reported that he is having pain in his feet from gout. Kevin Best is wearing crocks this morning. "They make my feet more comfortable." I advised that Kevin Best not exercise today. Kevin Best will not be here Friday but plans to return to exercise on Monday.

## 2015-08-18 ENCOUNTER — Encounter (HOSPITAL_COMMUNITY): Payer: Medicare Other

## 2015-08-21 ENCOUNTER — Encounter (HOSPITAL_COMMUNITY)
Admission: RE | Admit: 2015-08-21 | Discharge: 2015-08-21 | Disposition: A | Discharge status: Home / Self Care | Payer: Medicare Other | Source: Ambulatory Visit | Attending: Cardiology | Admitting: Cardiology

## 2015-08-21 DIAGNOSIS — I213 ST elevation (STEMI) myocardial infarction of unspecified site: Secondary | ICD-10-CM | POA: Diagnosis not present

## 2015-08-21 DIAGNOSIS — Z955 Presence of coronary angioplasty implant and graft: Secondary | ICD-10-CM | POA: Diagnosis not present

## 2015-08-23 ENCOUNTER — Encounter (HOSPITAL_COMMUNITY)
Admission: RE | Admit: 2015-08-23 | Discharge: 2015-08-23 | Disposition: A | Discharge status: Home / Self Care | Payer: Medicare Other | Source: Ambulatory Visit | Attending: Cardiology | Admitting: Cardiology

## 2015-08-23 ENCOUNTER — Telehealth: Payer: Self-pay | Admitting: Cardiology

## 2015-08-23 DIAGNOSIS — I213 ST elevation (STEMI) myocardial infarction of unspecified site: Secondary | ICD-10-CM | POA: Diagnosis not present

## 2015-08-23 DIAGNOSIS — Z955 Presence of coronary angioplasty implant and graft: Secondary | ICD-10-CM | POA: Diagnosis not present

## 2015-08-23 NOTE — Telephone Encounter (Signed)
Forward to Cheryl LPN 

## 2015-08-23 NOTE — Telephone Encounter (Signed)
Spoke to patient's wife.She stated since husband stopped lipitor his leg pain has improved.Stated he has been having lower back pain after walking on treadmill at cardiac rehab.Stated he did not want to start on a statin drug.Advised I will let Dr.Jordan.

## 2015-08-23 NOTE — Telephone Encounter (Signed)
Returned call to patient.Spoke to wife she stated husband needed a letter from [place] saying ok to have a deep muscle message for back pain at [facility].Advised Dr.Jordan out of office this afternoon.Advised I will speak to [place] 08/24/15 and will leave letter at front desk for pick up.

## 2015-08-23 NOTE — Telephone Encounter (Signed)
Mrs. Haag is calling because, Mr. Bonadio is schedule to have a massage on tomorrow. The massage therapist is asking for a note to say that it would be ok for him to have a massage . Please call   Thanks

## 2015-08-24 NOTE — Telephone Encounter (Signed)
Patient's wife walked in office.Dr.Jordan advised no deep muscle message since he is on dual platelet therapy.Message Evy called left message Dr.Jordan advised no deep muscle message.

## 2015-08-25 ENCOUNTER — Encounter (HOSPITAL_COMMUNITY)
Admission: RE | Admit: 2015-08-25 | Discharge: 2015-08-25 | Disposition: A | Discharge status: Home / Self Care | Payer: Medicare Other | Source: Ambulatory Visit | Attending: Cardiology | Admitting: Cardiology

## 2015-08-25 DIAGNOSIS — Z955 Presence of coronary angioplasty implant and graft: Secondary | ICD-10-CM | POA: Diagnosis not present

## 2015-08-25 DIAGNOSIS — I213 ST elevation (STEMI) myocardial infarction of unspecified site: Secondary | ICD-10-CM | POA: Diagnosis not present

## 2015-08-25 NOTE — Progress Notes (Signed)
Reviewed home exercise with pt today.  Pt plans to continue walking at home for exercise.  Reviewed THR, pulse (uses FitBit), RPE, sign and symptoms, NTG use, and when to call 911 or MD.  Pt voiced understanding. Alberteen Sam, MA, ACSM RCEP

## 2015-08-28 ENCOUNTER — Encounter (HOSPITAL_COMMUNITY)
Admission: RE | Admit: 2015-08-28 | Discharge: 2015-08-28 | Disposition: A | Discharge status: Home / Self Care | Payer: Medicare Other | Source: Ambulatory Visit | Attending: Cardiology | Admitting: Cardiology

## 2015-08-28 DIAGNOSIS — I213 ST elevation (STEMI) myocardial infarction of unspecified site: Secondary | ICD-10-CM | POA: Diagnosis not present

## 2015-08-28 DIAGNOSIS — Z955 Presence of coronary angioplasty implant and graft: Secondary | ICD-10-CM | POA: Diagnosis not present

## 2015-08-30 ENCOUNTER — Encounter (HOSPITAL_COMMUNITY)
Admission: RE | Admit: 2015-08-30 | Discharge: 2015-08-30 | Disposition: A | Discharge status: Home / Self Care | Payer: Medicare Other | Source: Ambulatory Visit | Attending: Cardiology | Admitting: Cardiology

## 2015-08-30 DIAGNOSIS — Z955 Presence of coronary angioplasty implant and graft: Secondary | ICD-10-CM | POA: Diagnosis not present

## 2015-08-30 DIAGNOSIS — I213 ST elevation (STEMI) myocardial infarction of unspecified site: Secondary | ICD-10-CM | POA: Diagnosis not present

## 2015-09-04 ENCOUNTER — Encounter (HOSPITAL_COMMUNITY)
Admission: RE | Admit: 2015-09-04 | Discharge: 2015-09-04 | Disposition: A | Discharge status: Home / Self Care | Payer: Medicare Other | Source: Ambulatory Visit | Attending: Cardiology | Admitting: Cardiology

## 2015-09-04 DIAGNOSIS — I213 ST elevation (STEMI) myocardial infarction of unspecified site: Secondary | ICD-10-CM | POA: Diagnosis not present

## 2015-09-04 DIAGNOSIS — Z955 Presence of coronary angioplasty implant and graft: Secondary | ICD-10-CM | POA: Diagnosis not present

## 2015-09-06 ENCOUNTER — Encounter (HOSPITAL_COMMUNITY)
Admission: RE | Admit: 2015-09-06 | Discharge: 2015-09-06 | Disposition: A | Discharge status: Home / Self Care | Payer: Medicare Other | Source: Ambulatory Visit | Attending: Cardiology | Admitting: Cardiology

## 2015-09-06 DIAGNOSIS — Z955 Presence of coronary angioplasty implant and graft: Secondary | ICD-10-CM | POA: Diagnosis not present

## 2015-09-06 DIAGNOSIS — I213 ST elevation (STEMI) myocardial infarction of unspecified site: Secondary | ICD-10-CM | POA: Diagnosis not present

## 2015-09-06 NOTE — Progress Notes (Signed)
Kevin Best 69 y.o. male Nutrition Note Spoke with pt. Nutrition Survey reviewed with pt. Pt is working toward following the Therapeutic Lifestyle Changes diet. Pt reports he has decreased his bread intake and is eating less "white foods." Pt wants to lose wt. Pt reports his pre-procedure wt was 325 lb. Pt wt today 299.4 lb, which is down 25 lb from pt's reported UBW. Pt has been trying to lose wt by changing his diet and exercising. Wt loss tips briefly reviewed. Pt is pre-diabetic according to his most recent A1c. Pre-diabetes discussed. Pt expressed understanding of the information reviewed. Pt aware of nutrition education classes offered and plans on attending nutrition classes. Lab Results  Component Value Date   HGBA1C 6.0* 05/24/2015   Wt Readings from Last 3 Encounters:  08/10/15 299 lb 9.7 oz (135.9 kg)  07/13/15 292 lb 6 oz (132.62 kg)  06/01/15 304 lb 9.6 oz (138.166 kg)   Nutrition Diagnosis ? Food-and nutrition-related knowledge deficit related to lack of exposure to information as related to diagnosis of: ? CVD ? Pre-DM ? Obesity related to excessive energy intake as evidenced by a BMI of 43.6  Nutrition Intervention ? Benefits of adopting Therapeutic Lifestyle Changes discussed when Medficts reviewed. ? Pt to attend the Portion Distortion class ? Pt to attend the  ? Nutrition I class                      ? Nutrition II class - met; 08/15/15 ? Pt given handouts for: ? Pre-diabetes ? Continue client-centered nutrition education by RD, as part of interdisciplinary care.  Goal(s) ? Pt to go easy on snack chips and crackers. ? Pt to identify and limit food sources of saturated fat, trans fat, and cholesterol  Monitor and Evaluate progress toward nutrition goal with team.   , M.Ed, RD, LDN, CDE 09/06/2015 11:18 AM 

## 2015-09-08 ENCOUNTER — Encounter (HOSPITAL_COMMUNITY)
Admission: RE | Admit: 2015-09-08 | Discharge: 2015-09-08 | Disposition: A | Discharge status: Home / Self Care | Payer: Medicare Other | Source: Ambulatory Visit | Attending: Cardiology | Admitting: Cardiology

## 2015-09-08 DIAGNOSIS — I213 ST elevation (STEMI) myocardial infarction of unspecified site: Secondary | ICD-10-CM | POA: Diagnosis not present

## 2015-09-08 DIAGNOSIS — Z955 Presence of coronary angioplasty implant and graft: Secondary | ICD-10-CM | POA: Diagnosis not present

## 2015-09-11 ENCOUNTER — Encounter (HOSPITAL_COMMUNITY)
Admission: RE | Admit: 2015-09-11 | Discharge: 2015-09-11 | Disposition: A | Discharge status: Home / Self Care | Payer: Medicare Other | Source: Ambulatory Visit | Attending: Cardiology | Admitting: Cardiology

## 2015-09-11 DIAGNOSIS — Z955 Presence of coronary angioplasty implant and graft: Secondary | ICD-10-CM | POA: Diagnosis not present

## 2015-09-11 DIAGNOSIS — I213 ST elevation (STEMI) myocardial infarction of unspecified site: Secondary | ICD-10-CM | POA: Diagnosis not present

## 2015-09-11 NOTE — Progress Notes (Signed)
QUALITY OF LIFE SCORE REVIEW I reviewed Kevin Best's quality of life questionnaire with him this morning. Kevin Best reports feeling better since his statin has placed on hold. Kevin Best is also seeing a Restaurant manager, fast food. The patient still reports having low energy but has been trying to increase his activity level and has a fit bit. Kevin Best has a goal to walk 1000 steps a day. Will continue to monitor the patient throughout  the program. Patient encouraged to continue his healthy lifestyle and praised for his efforts.

## 2015-09-13 ENCOUNTER — Encounter (HOSPITAL_COMMUNITY)
Admission: RE | Admit: 2015-09-13 | Discharge: 2015-09-13 | Disposition: A | Discharge status: Home / Self Care | Payer: Medicare Other | Source: Ambulatory Visit | Attending: Cardiology | Admitting: Cardiology

## 2015-09-13 DIAGNOSIS — Z955 Presence of coronary angioplasty implant and graft: Secondary | ICD-10-CM | POA: Diagnosis not present

## 2015-09-13 DIAGNOSIS — I213 ST elevation (STEMI) myocardial infarction of unspecified site: Secondary | ICD-10-CM | POA: Diagnosis not present

## 2015-09-14 ENCOUNTER — Telehealth: Payer: Self-pay

## 2015-09-14 NOTE — Telephone Encounter (Signed)
Patient called no answer.Left message on personal voice mail I was calling to see if your leg pain better since stopping lipitor.Advised to call me back 09/15/15.

## 2015-09-15 ENCOUNTER — Encounter (HOSPITAL_COMMUNITY): Payer: Medicare Other

## 2015-09-18 ENCOUNTER — Encounter (HOSPITAL_COMMUNITY): Payer: Medicare Other

## 2015-09-20 ENCOUNTER — Encounter (HOSPITAL_COMMUNITY)
Admission: RE | Admit: 2015-09-20 | Discharge: 2015-09-20 | Disposition: A | Discharge status: Home / Self Care | Payer: Medicare Other | Source: Ambulatory Visit | Attending: Cardiology | Admitting: Cardiology

## 2015-09-20 DIAGNOSIS — I213 ST elevation (STEMI) myocardial infarction of unspecified site: Secondary | ICD-10-CM | POA: Diagnosis not present

## 2015-09-20 DIAGNOSIS — Z955 Presence of coronary angioplasty implant and graft: Secondary | ICD-10-CM | POA: Diagnosis not present

## 2015-09-21 DIAGNOSIS — R300 Dysuria: Secondary | ICD-10-CM | POA: Diagnosis not present

## 2015-09-22 ENCOUNTER — Encounter (HOSPITAL_COMMUNITY)
Admission: RE | Admit: 2015-09-22 | Discharge: 2015-09-22 | Disposition: A | Discharge status: Home / Self Care | Payer: Medicare Other | Source: Ambulatory Visit | Attending: Cardiology | Admitting: Cardiology

## 2015-09-22 DIAGNOSIS — I213 ST elevation (STEMI) myocardial infarction of unspecified site: Secondary | ICD-10-CM | POA: Diagnosis not present

## 2015-09-22 DIAGNOSIS — Z955 Presence of coronary angioplasty implant and graft: Secondary | ICD-10-CM | POA: Diagnosis not present

## 2015-09-25 ENCOUNTER — Encounter (HOSPITAL_COMMUNITY)
Admission: RE | Admit: 2015-09-25 | Discharge: 2015-09-25 | Disposition: A | Discharge status: Home / Self Care | Payer: Medicare Other | Source: Ambulatory Visit | Attending: Cardiology | Admitting: Cardiology

## 2015-09-25 DIAGNOSIS — I213 ST elevation (STEMI) myocardial infarction of unspecified site: Secondary | ICD-10-CM | POA: Diagnosis not present

## 2015-09-25 DIAGNOSIS — Z955 Presence of coronary angioplasty implant and graft: Secondary | ICD-10-CM | POA: Diagnosis not present

## 2015-09-27 ENCOUNTER — Encounter (HOSPITAL_COMMUNITY)
Admission: RE | Admit: 2015-09-27 | Discharge: 2015-09-27 | Disposition: A | Discharge status: Home / Self Care | Payer: Medicare Other | Source: Ambulatory Visit | Attending: Cardiology | Admitting: Cardiology

## 2015-09-27 DIAGNOSIS — I213 ST elevation (STEMI) myocardial infarction of unspecified site: Secondary | ICD-10-CM | POA: Diagnosis not present

## 2015-09-27 DIAGNOSIS — Z955 Presence of coronary angioplasty implant and graft: Secondary | ICD-10-CM | POA: Diagnosis not present

## 2015-09-29 ENCOUNTER — Encounter (HOSPITAL_COMMUNITY)
Admission: RE | Admit: 2015-09-29 | Discharge: 2015-09-29 | Disposition: A | Discharge status: Home / Self Care | Payer: Medicare Other | Source: Ambulatory Visit | Attending: Cardiology | Admitting: Cardiology

## 2015-09-29 DIAGNOSIS — Z955 Presence of coronary angioplasty implant and graft: Secondary | ICD-10-CM | POA: Diagnosis not present

## 2015-09-29 DIAGNOSIS — I213 ST elevation (STEMI) myocardial infarction of unspecified site: Secondary | ICD-10-CM | POA: Diagnosis not present

## 2015-10-04 ENCOUNTER — Encounter (HOSPITAL_COMMUNITY)
Admission: RE | Admit: 2015-10-04 | Discharge: 2015-10-04 | Disposition: A | Discharge status: Home / Self Care | Payer: Medicare Other | Source: Ambulatory Visit | Attending: Cardiology | Admitting: Cardiology

## 2015-10-04 DIAGNOSIS — I213 ST elevation (STEMI) myocardial infarction of unspecified site: Secondary | ICD-10-CM | POA: Diagnosis not present

## 2015-10-04 DIAGNOSIS — Z955 Presence of coronary angioplasty implant and graft: Secondary | ICD-10-CM | POA: Diagnosis not present

## 2015-10-06 ENCOUNTER — Encounter (HOSPITAL_COMMUNITY)
Admission: RE | Admit: 2015-10-06 | Discharge: 2015-10-06 | Disposition: A | Discharge status: Home / Self Care | Payer: Medicare Other | Source: Ambulatory Visit | Attending: Cardiology | Admitting: Cardiology

## 2015-10-06 DIAGNOSIS — Z955 Presence of coronary angioplasty implant and graft: Secondary | ICD-10-CM | POA: Diagnosis not present

## 2015-10-06 DIAGNOSIS — I213 ST elevation (STEMI) myocardial infarction of unspecified site: Secondary | ICD-10-CM | POA: Diagnosis not present

## 2015-10-09 ENCOUNTER — Encounter (HOSPITAL_COMMUNITY): Payer: Medicare Other

## 2015-10-09 DIAGNOSIS — I213 ST elevation (STEMI) myocardial infarction of unspecified site: Secondary | ICD-10-CM | POA: Insufficient documentation

## 2015-10-09 DIAGNOSIS — Z955 Presence of coronary angioplasty implant and graft: Secondary | ICD-10-CM | POA: Insufficient documentation

## 2015-10-11 ENCOUNTER — Encounter (HOSPITAL_COMMUNITY)
Admission: RE | Admit: 2015-10-11 | Discharge: 2015-10-11 | Disposition: A | Discharge status: Home / Self Care | Payer: Medicare Other | Source: Ambulatory Visit | Attending: Cardiology | Admitting: Cardiology

## 2015-10-11 DIAGNOSIS — I213 ST elevation (STEMI) myocardial infarction of unspecified site: Secondary | ICD-10-CM | POA: Diagnosis not present

## 2015-10-11 DIAGNOSIS — Z955 Presence of coronary angioplasty implant and graft: Secondary | ICD-10-CM | POA: Diagnosis not present

## 2015-10-13 ENCOUNTER — Encounter (HOSPITAL_COMMUNITY)
Admission: RE | Admit: 2015-10-13 | Discharge: 2015-10-13 | Disposition: A | Discharge status: Home / Self Care | Payer: Medicare Other | Source: Ambulatory Visit | Attending: Cardiology | Admitting: Cardiology

## 2015-10-13 DIAGNOSIS — I213 ST elevation (STEMI) myocardial infarction of unspecified site: Secondary | ICD-10-CM | POA: Diagnosis not present

## 2015-10-13 DIAGNOSIS — Z955 Presence of coronary angioplasty implant and graft: Secondary | ICD-10-CM | POA: Diagnosis not present

## 2015-10-16 ENCOUNTER — Encounter (HOSPITAL_COMMUNITY): Payer: Medicare Other

## 2015-10-18 ENCOUNTER — Encounter (HOSPITAL_COMMUNITY)
Admission: RE | Admit: 2015-10-18 | Discharge: 2015-10-18 | Disposition: A | Discharge status: Home / Self Care | Payer: Medicare Other | Source: Ambulatory Visit | Attending: Cardiology | Admitting: Cardiology

## 2015-10-18 DIAGNOSIS — Z955 Presence of coronary angioplasty implant and graft: Secondary | ICD-10-CM | POA: Diagnosis not present

## 2015-10-18 DIAGNOSIS — I213 ST elevation (STEMI) myocardial infarction of unspecified site: Secondary | ICD-10-CM | POA: Diagnosis not present

## 2015-10-20 ENCOUNTER — Encounter (HOSPITAL_COMMUNITY)
Admission: RE | Admit: 2015-10-20 | Discharge: 2015-10-20 | Disposition: A | Discharge status: Home / Self Care | Payer: Medicare Other | Source: Ambulatory Visit | Attending: Cardiology | Admitting: Cardiology

## 2015-10-20 DIAGNOSIS — Z955 Presence of coronary angioplasty implant and graft: Secondary | ICD-10-CM | POA: Diagnosis not present

## 2015-10-20 DIAGNOSIS — I213 ST elevation (STEMI) myocardial infarction of unspecified site: Secondary | ICD-10-CM | POA: Diagnosis not present

## 2015-10-23 ENCOUNTER — Encounter (HOSPITAL_COMMUNITY)
Admission: RE | Admit: 2015-10-23 | Discharge: 2015-10-23 | Disposition: A | Discharge status: Home / Self Care | Payer: Medicare Other | Source: Ambulatory Visit | Attending: Cardiology | Admitting: Cardiology

## 2015-10-23 DIAGNOSIS — I213 ST elevation (STEMI) myocardial infarction of unspecified site: Secondary | ICD-10-CM | POA: Diagnosis not present

## 2015-10-23 DIAGNOSIS — Z955 Presence of coronary angioplasty implant and graft: Secondary | ICD-10-CM | POA: Diagnosis not present

## 2015-10-25 ENCOUNTER — Encounter (HOSPITAL_COMMUNITY)
Admission: RE | Admit: 2015-10-25 | Discharge: 2015-10-25 | Disposition: A | Discharge status: Home / Self Care | Payer: Medicare Other | Source: Ambulatory Visit | Attending: Cardiology | Admitting: Cardiology

## 2015-10-25 DIAGNOSIS — Z955 Presence of coronary angioplasty implant and graft: Secondary | ICD-10-CM | POA: Diagnosis not present

## 2015-10-25 DIAGNOSIS — I213 ST elevation (STEMI) myocardial infarction of unspecified site: Secondary | ICD-10-CM | POA: Diagnosis not present

## 2015-10-27 ENCOUNTER — Encounter (HOSPITAL_COMMUNITY): Payer: Medicare Other

## 2015-10-27 ENCOUNTER — Telehealth (HOSPITAL_COMMUNITY): Payer: Self-pay | Admitting: Cardiac Rehabilitation

## 2015-10-27 ENCOUNTER — Telehealth (HOSPITAL_COMMUNITY): Payer: Self-pay

## 2015-10-27 NOTE — Telephone Encounter (Signed)
pc received from pt wife requesting cardiac rehab report be forwarded to pt PCP for appt Monday 10/30/15.  Also pt wife requested telephone information be updated in EPIC.  Lastly pt wife requests lipitor be removed from pt medication list since he is holding med at this time due to muscle ache.  Med list reconciled to update lipitor as not taking.  Phone numbers updated.

## 2015-10-30 ENCOUNTER — Encounter (HOSPITAL_COMMUNITY): Payer: Medicare Other

## 2015-10-30 DIAGNOSIS — D751 Secondary polycythemia: Secondary | ICD-10-CM | POA: Diagnosis not present

## 2015-10-30 DIAGNOSIS — Z0000 Encounter for general adult medical examination without abnormal findings: Secondary | ICD-10-CM | POA: Diagnosis not present

## 2015-10-30 DIAGNOSIS — Q8901 Asplenia (congenital): Secondary | ICD-10-CM | POA: Diagnosis not present

## 2015-10-30 DIAGNOSIS — N529 Male erectile dysfunction, unspecified: Secondary | ICD-10-CM | POA: Diagnosis not present

## 2015-10-30 DIAGNOSIS — Z125 Encounter for screening for malignant neoplasm of prostate: Secondary | ICD-10-CM | POA: Diagnosis not present

## 2015-10-30 DIAGNOSIS — I2510 Atherosclerotic heart disease of native coronary artery without angina pectoris: Secondary | ICD-10-CM | POA: Diagnosis not present

## 2015-10-30 DIAGNOSIS — G4733 Obstructive sleep apnea (adult) (pediatric): Secondary | ICD-10-CM | POA: Diagnosis not present

## 2015-10-30 DIAGNOSIS — M109 Gout, unspecified: Secondary | ICD-10-CM | POA: Diagnosis not present

## 2015-10-30 DIAGNOSIS — Z1389 Encounter for screening for other disorder: Secondary | ICD-10-CM | POA: Diagnosis not present

## 2015-10-30 DIAGNOSIS — I10 Essential (primary) hypertension: Secondary | ICD-10-CM | POA: Diagnosis not present

## 2015-10-30 DIAGNOSIS — R7303 Prediabetes: Secondary | ICD-10-CM | POA: Diagnosis not present

## 2015-10-30 DIAGNOSIS — I252 Old myocardial infarction: Secondary | ICD-10-CM | POA: Diagnosis not present

## 2015-10-30 DIAGNOSIS — R7309 Other abnormal glucose: Secondary | ICD-10-CM | POA: Diagnosis not present

## 2015-11-01 ENCOUNTER — Encounter (HOSPITAL_COMMUNITY)
Admission: RE | Admit: 2015-11-01 | Discharge: 2015-11-01 | Disposition: A | Discharge status: Home / Self Care | Payer: Medicare Other | Source: Ambulatory Visit | Attending: Cardiology | Admitting: Cardiology

## 2015-11-01 DIAGNOSIS — I213 ST elevation (STEMI) myocardial infarction of unspecified site: Secondary | ICD-10-CM | POA: Diagnosis not present

## 2015-11-01 DIAGNOSIS — Z955 Presence of coronary angioplasty implant and graft: Secondary | ICD-10-CM | POA: Diagnosis not present

## 2015-11-03 ENCOUNTER — Encounter (HOSPITAL_COMMUNITY)
Admission: RE | Admit: 2015-11-03 | Discharge: 2015-11-03 | Disposition: A | Discharge status: Home / Self Care | Payer: Medicare Other | Source: Ambulatory Visit | Attending: Cardiology | Admitting: Cardiology

## 2015-11-03 DIAGNOSIS — Z955 Presence of coronary angioplasty implant and graft: Secondary | ICD-10-CM | POA: Diagnosis not present

## 2015-11-03 DIAGNOSIS — I213 ST elevation (STEMI) myocardial infarction of unspecified site: Secondary | ICD-10-CM | POA: Diagnosis not present

## 2015-11-06 ENCOUNTER — Encounter (HOSPITAL_COMMUNITY)
Admission: RE | Admit: 2015-11-06 | Discharge: 2015-11-06 | Disposition: A | Discharge status: Home / Self Care | Payer: Medicare Other | Source: Ambulatory Visit | Attending: Cardiology | Admitting: Cardiology

## 2015-11-06 DIAGNOSIS — I213 ST elevation (STEMI) myocardial infarction of unspecified site: Secondary | ICD-10-CM | POA: Diagnosis not present

## 2015-11-06 DIAGNOSIS — Z955 Presence of coronary angioplasty implant and graft: Secondary | ICD-10-CM | POA: Diagnosis not present

## 2015-11-08 ENCOUNTER — Encounter (HOSPITAL_COMMUNITY)
Admission: RE | Admit: 2015-11-08 | Discharge: 2015-11-08 | Disposition: A | Discharge status: Home / Self Care | Payer: Medicare Other | Source: Ambulatory Visit | Attending: Cardiology | Admitting: Cardiology

## 2015-11-08 DIAGNOSIS — Z955 Presence of coronary angioplasty implant and graft: Secondary | ICD-10-CM | POA: Insufficient documentation

## 2015-11-08 DIAGNOSIS — I213 ST elevation (STEMI) myocardial infarction of unspecified site: Secondary | ICD-10-CM | POA: Insufficient documentation

## 2015-11-10 ENCOUNTER — Encounter (HOSPITAL_COMMUNITY)
Admission: RE | Admit: 2015-11-10 | Discharge: 2015-11-10 | Disposition: A | Discharge status: Home / Self Care | Payer: Medicare Other | Source: Ambulatory Visit | Attending: Cardiology | Admitting: Cardiology

## 2015-11-10 DIAGNOSIS — I213 ST elevation (STEMI) myocardial infarction of unspecified site: Secondary | ICD-10-CM | POA: Diagnosis not present

## 2015-11-10 DIAGNOSIS — Z955 Presence of coronary angioplasty implant and graft: Secondary | ICD-10-CM | POA: Diagnosis not present

## 2015-11-10 NOTE — Progress Notes (Signed)
Kevin Best 's weight gained 4.7 kg since he started cardiac rehab on 08/14/2015. Weight today 139.3. Mr Villa's weight was 134.6 kg when he started the program. Kevin Best Denies any shortness of breath. Lung fields clear upon ascultation. Kevin Best told me his wife made a loaf of [employer] [place] bread and that he ate a whole 1/2 of a loaf yesterday. Oxygen saturation 96% on Room air. Kevin Best reports that he is generally feeling well and has no complaints of muscle aches anymore. Kevin Best says he is going to be more careful with his dietary intake. Will fax exercise flow sheets to Dr. Doug Sou office for review.

## 2015-11-13 ENCOUNTER — Encounter (HOSPITAL_COMMUNITY)
Admission: RE | Admit: 2015-11-13 | Discharge: 2015-11-13 | Disposition: A | Discharge status: Home / Self Care | Payer: Medicare Other | Source: Ambulatory Visit | Attending: Cardiology | Admitting: Cardiology

## 2015-11-13 DIAGNOSIS — I213 ST elevation (STEMI) myocardial infarction of unspecified site: Secondary | ICD-10-CM | POA: Diagnosis not present

## 2015-11-13 DIAGNOSIS — Z955 Presence of coronary angioplasty implant and graft: Secondary | ICD-10-CM | POA: Diagnosis not present

## 2015-11-13 NOTE — Progress Notes (Signed)
  30 day Psychosocial followup assessment  Patient psychosocial assessment reveals no barriers to cardiac rehab participation.  Patient does feel like he is making progress towards cardiac rehab goals.  Patient reports health and activity level has improved in the past 30 days . Patient reports feeling positive about current and projected progress toward cardiac rehab goals.  Patient's rate of progress towards goals is very good.  Will continue to monitor and evaluate progress toward psychosocial goals.   Goals in progress:    Help patient work toward returning to meaningful activities that improve patient's quality of life and are attainable.

## 2015-11-15 ENCOUNTER — Encounter (HOSPITAL_COMMUNITY)
Admission: RE | Admit: 2015-11-15 | Discharge: 2015-11-15 | Disposition: A | Discharge status: Home / Self Care | Payer: Medicare Other | Source: Ambulatory Visit | Attending: Cardiology | Admitting: Cardiology

## 2015-11-15 DIAGNOSIS — I213 ST elevation (STEMI) myocardial infarction of unspecified site: Secondary | ICD-10-CM | POA: Diagnosis not present

## 2015-11-15 DIAGNOSIS — Z955 Presence of coronary angioplasty implant and graft: Secondary | ICD-10-CM | POA: Diagnosis not present

## 2015-11-17 ENCOUNTER — Encounter (HOSPITAL_COMMUNITY): Payer: Medicare Other

## 2015-11-20 ENCOUNTER — Encounter (HOSPITAL_COMMUNITY): Payer: Medicare Other

## 2015-11-22 ENCOUNTER — Encounter (HOSPITAL_COMMUNITY): Payer: Medicare Other

## 2015-11-24 ENCOUNTER — Encounter: Payer: Self-pay | Admitting: Cardiology

## 2015-11-24 ENCOUNTER — Encounter (HOSPITAL_COMMUNITY): Payer: Medicare Other

## 2015-11-27 ENCOUNTER — Encounter (HOSPITAL_COMMUNITY): Payer: Medicare Other

## 2015-11-29 ENCOUNTER — Encounter (HOSPITAL_COMMUNITY): Payer: Medicare Other

## 2015-12-01 ENCOUNTER — Encounter (HOSPITAL_COMMUNITY): Payer: Medicare Other

## 2015-12-04 ENCOUNTER — Encounter (HOSPITAL_COMMUNITY): Payer: Medicare Other

## 2015-12-06 ENCOUNTER — Encounter (HOSPITAL_COMMUNITY): Payer: Medicare Other

## 2015-12-07 ENCOUNTER — Ambulatory Visit (INDEPENDENT_AMBULATORY_CARE_PROVIDER_SITE_OTHER): Payer: Medicare Other | Admitting: Cardiology

## 2015-12-07 ENCOUNTER — Encounter: Payer: Self-pay | Admitting: Cardiology

## 2015-12-07 VITALS — BP 134/72 | HR 62 | Ht 70.0 in | Wt 309.0 lb

## 2015-12-07 DIAGNOSIS — I10 Essential (primary) hypertension: Secondary | ICD-10-CM | POA: Diagnosis not present

## 2015-12-07 DIAGNOSIS — E785 Hyperlipidemia, unspecified: Secondary | ICD-10-CM | POA: Diagnosis not present

## 2015-12-07 DIAGNOSIS — I2510 Atherosclerotic heart disease of native coronary artery without angina pectoris: Secondary | ICD-10-CM

## 2015-12-07 NOTE — Progress Notes (Signed)
Cardiology Office Note   Date:  12/07/2015   ID:  Kevin Best, DOB [birth year], MRN [id number]  PCP:  Gara Kroner, MD  Cardiologist:  Dr. [name]    Chief Complaint  Patient presents with  . Follow-up    Patient has no complaints.      History of Present Illness: Kevin Best is a 70 y.o. male who presents for follow up of CAD. His past medical history of hypertension and obstructive sleep apnea on CPAP who did not have prior h/o CAD until he presented with inferolateral STEMI in August 2016.  Emergent cath lab and DES to RCA prox to mid. Single vessel disease.   On follow up today he denies any chest pain or DOE. He has been staying in [place].and has gained 10 lbs.  He is actively exercising with Cardiac Rehab. No chest pain, SOB, edema, or palpitations. Still wearing CPAP at night. Last November developed acute back pain. Thought this was related to lipitor. Back pain resolved and he is back on lipitor without problems.    Past Medical History  Diagnosis Date  . Environmental allergies   . Sleep apnea   . ALLERGIC RHINITIS   . Hypertension   . Prediabetes 05/25/2015  . Hyperlipidemia 05/24/2015  . CAD (coronary artery disease)     s/p inferolateral STEMI 05/23/2015 DES to prox-mid RCA, 100% occluded RPLB 2    Past Surgical History  Procedure Laterality Date  . Splenectomy, total  2005  . Throat surgery    . Uvulopalatopharyngoplasty    . Cardiac catheterization N/A 05/23/2015    Procedure: Left Heart Cath and Coronary Angiography;  Surgeon:  [name], MD;  Location: [place] [facility];  Service: Cardiovascular;  Laterality: N/A;  . Cardiac catheterization  05/23/2015    Procedure: Coronary Stent Intervention;  Surgeon:  [name], MD;  Location: [place] [facility];  Service: Cardiovascular;;     Current Outpatient Prescriptions  Medication Sig Dispense Refill  . aspirin EC 81 MG EC tablet Take 1 tablet (81 mg total) by mouth daily.    . clopidogrel (PLAVIX) 75  MG tablet Take 1 tablet (75 mg total) by mouth daily. 360 tablet 0  . losartan (COZAAR) 100 MG tablet Take 100 mg by mouth daily.    . metoprolol succinate (TOPROL-XL) 25 MG 24 hr tablet Take 1 tablet (25 mg total) by mouth daily. 90 tablet 3  . nitroGLYCERIN (NITROSTAT) 0.4 MG SL tablet Place 1 tablet (0.4 mg total) under the tongue every 5 (five) minutes x 3 doses as needed for chest pain. 25 tablet 3   No current facility-administered medications for this visit.    Allergies:   Review of patient's allergies indicates no known allergies.    Social History:  The patient  reports that he has never smoked. He does not have any smokeless tobacco history on file. He reports that he does not drink alcohol or use illicit drugs.   Family History:  The patient's family history includes Melanoma in his father.    ROS: As noted in HPI. All other systems are reviewed and are negative.  Wt Readings from Last 3 Encounters:  12/07/15 140.161 kg (309 lb)  08/10/15 135.9 kg (299 lb 9.7 oz)  07/13/15 132.62 kg (292 lb 6 oz)     PHYSICAL EXAM: VS:  BP 134/72 mmHg  Pulse 62  Ht 5\' 10"  (1.778 m)  Wt 140.161 kg (309 lb)  BMI 44.34 kg/m2 , BMI  Body mass index is 44.34 kg/(m^2). General:Pleasant affect, obese, NAD Skin:Warm and dry, brisk capillary refill HEENT:normocephalic, sclera clear, mucus membranes moist Neck:supple, no JVD, no bruits  Heart:S1S2 RRR without murmur, gallup, rub or click Lungs:clear- clear without rales, rhonchi, or wheezes [id number], soft, non tender, + BS, do not palpate liver spleen or masses Ext:no lower ext edema, 2+ pedal pulses, 2+ radial pulses- cath site healed. Neuro:alert and oriented, MAE, follows commands, + facial symmetry    EKG:  EKG is not ordered today.    Recent Labs: 05/24/2015: BUN 12; Creatinine, Ser 1.13; Hemoglobin 17.4*; Platelets 197; Potassium 4.1; Sodium 136 07/10/2015: ALT 21    Lipid Panel    Component Value Date/Time   CHOL 99*  07/10/2015 1015   TRIG 91 07/10/2015 1015   HDL 35* 07/10/2015 1015   CHOLHDL 2.8 07/10/2015 1015   VLDL 18 07/10/2015 1015   LDLCALC 46 07/10/2015 1015       Other studies Reviewed: Additional studies/ records that were reviewed today include:    ASSESSMENT AND PLAN:  1.  ST elevation myocardial infarction (STEMI) of inferior wall-August 2016.  A Synergy DES to pRCA to mRCA. This was a large vessel.  Plan DAPT for 1 year. Continue current medication. Continue efforts at Rehab and lose weight.  2. CAD- 2nd RPLB 100%- chronic 3. Obstructive sleep apnea 4.  HTN (hypertension)- controlled 5. Morbid obesity-  Encouraged more efforts at weight loss.  6.  Hyperlipidemia  Will repeat fasting lab work on lipitor 40 mg daily.  7.  Prediabetes Hgb A1C 6.0     Current medicines are reviewed with the patient today.  The patient Has no concerns regarding medicines.  The following changes have been made:  See above Labs/ tests ordered today include:see above  Disposition:   FU:  6 months.   Signed,  [name], MD  12/07/2015 8:27 AM    [facility] [place], [place], [place] Phone: [phone]; Fax: [phone]

## 2015-12-07 NOTE — Patient Instructions (Signed)
Continue your current therapy  We will check fasting lab work  I will see you in 6 months

## 2015-12-08 ENCOUNTER — Encounter (HOSPITAL_COMMUNITY): Payer: Medicare Other

## 2015-12-11 ENCOUNTER — Encounter (HOSPITAL_COMMUNITY): Payer: Medicare Other

## 2015-12-13 ENCOUNTER — Encounter (HOSPITAL_COMMUNITY): Payer: Medicare Other

## 2015-12-13 DIAGNOSIS — I2510 Atherosclerotic heart disease of native coronary artery without angina pectoris: Secondary | ICD-10-CM | POA: Diagnosis not present

## 2015-12-13 DIAGNOSIS — E785 Hyperlipidemia, unspecified: Secondary | ICD-10-CM | POA: Diagnosis not present

## 2015-12-13 DIAGNOSIS — I10 Essential (primary) hypertension: Secondary | ICD-10-CM | POA: Diagnosis not present

## 2015-12-14 LAB — HEPATIC FUNCTION PANEL
ALK PHOS: 91 U/L (ref 40–115)
ALT: 19 U/L (ref 9–46)
AST: 16 U/L (ref 10–35)
Albumin: 3.8 g/dL (ref 3.6–5.1)
BILIRUBIN DIRECT: 0.3 mg/dL — AB (ref <=0.2)
BILIRUBIN INDIRECT: 0.9 mg/dL (ref 0.2–1.2)
TOTAL PROTEIN: 6.5 g/dL (ref 6.1–8.1)
Total Bilirubin: 1.2 mg/dL (ref 0.2–1.2)

## 2015-12-14 LAB — LIPID PANEL
Cholesterol: 107 mg/dL — ABNORMAL LOW (ref 125–200)
HDL: 37 mg/dL — AB (ref >=40)
LDL Cholesterol: 42 mg/dL (ref <130)
TRIGLYCERIDES: 140 mg/dL (ref <150)
Total CHOL/HDL Ratio: 2.9 Ratio (ref <=5.0)
VLDL: 28 mg/dL (ref <30)

## 2015-12-14 LAB — BASIC METABOLIC PANEL
BUN: 15 mg/dL (ref 7–25)
CALCIUM: 9.3 mg/dL (ref 8.6–10.3)
CO2: 30 mmol/L (ref 20–31)
CREATININE: 1.38 mg/dL — AB (ref 0.70–1.18)
Chloride: 103 mmol/L (ref 98–110)
Glucose, Bld: 108 mg/dL — ABNORMAL HIGH (ref 65–99)
Potassium: 4.7 mmol/L (ref 3.5–5.3)
Sodium: 140 mmol/L (ref 135–146)

## 2015-12-15 ENCOUNTER — Encounter (HOSPITAL_COMMUNITY): Payer: Medicare Other

## 2016-01-17 ENCOUNTER — Other Ambulatory Visit: Payer: Self-pay

## 2016-01-17 MED ORDER — METOPROLOL SUCCINATE ER 25 MG PO TB24: 25.0000 mg | ORAL_TABLET | Freq: Every day | ORAL | Status: DC

## 2016-02-26 ENCOUNTER — Telehealth: Payer: Self-pay | Admitting: Internal Medicine

## 2016-02-26 NOTE — Telephone Encounter (Signed)
Spoke with pt and advised him that since we have not seen him in 5 years he would need to be seen as a consult to re-establish care with [facility]. Also advised pt that he may be able to contact his PCP and ask for a rx for CPAP supplies with the explanation that we will be seeing him in August. Pt asked me to explain all this to his wife and I repeated everything I had said to him. I asked the wife did she want to go ahead and make an appt to re-establish care and she said no that they would call back. Nothing further needed.

## 2016-03-29 DIAGNOSIS — B349 Viral infection, unspecified: Secondary | ICD-10-CM | POA: Diagnosis not present

## 2016-04-07 DIAGNOSIS — J019 Acute sinusitis, unspecified: Secondary | ICD-10-CM | POA: Diagnosis not present

## 2016-04-22 DIAGNOSIS — H9201 Otalgia, right ear: Secondary | ICD-10-CM | POA: Diagnosis not present

## 2016-04-22 DIAGNOSIS — J019 Acute sinusitis, unspecified: Secondary | ICD-10-CM | POA: Diagnosis not present

## 2016-04-22 DIAGNOSIS — J069 Acute upper respiratory infection, unspecified: Secondary | ICD-10-CM | POA: Diagnosis not present

## 2016-04-26 DIAGNOSIS — G4733 Obstructive sleep apnea (adult) (pediatric): Secondary | ICD-10-CM | POA: Diagnosis not present

## 2016-04-26 DIAGNOSIS — J310 Chronic rhinitis: Secondary | ICD-10-CM | POA: Diagnosis not present

## 2016-05-06 DIAGNOSIS — R7303 Prediabetes: Secondary | ICD-10-CM | POA: Diagnosis not present

## 2016-05-06 DIAGNOSIS — I10 Essential (primary) hypertension: Secondary | ICD-10-CM | POA: Diagnosis not present

## 2016-05-06 DIAGNOSIS — N529 Male erectile dysfunction, unspecified: Secondary | ICD-10-CM | POA: Diagnosis not present

## 2016-05-06 DIAGNOSIS — Z1211 Encounter for screening for malignant neoplasm of colon: Secondary | ICD-10-CM | POA: Diagnosis not present

## 2016-05-06 DIAGNOSIS — D751 Secondary polycythemia: Secondary | ICD-10-CM | POA: Diagnosis not present

## 2016-05-06 DIAGNOSIS — I2510 Atherosclerotic heart disease of native coronary artery without angina pectoris: Secondary | ICD-10-CM | POA: Diagnosis not present

## 2016-05-06 DIAGNOSIS — R7309 Other abnormal glucose: Secondary | ICD-10-CM | POA: Diagnosis not present

## 2016-05-06 DIAGNOSIS — Q8901 Asplenia (congenital): Secondary | ICD-10-CM | POA: Diagnosis not present

## 2016-05-06 DIAGNOSIS — G4733 Obstructive sleep apnea (adult) (pediatric): Secondary | ICD-10-CM | POA: Diagnosis not present

## 2016-05-06 DIAGNOSIS — I252 Old myocardial infarction: Secondary | ICD-10-CM | POA: Diagnosis not present

## 2016-05-06 DIAGNOSIS — M109 Gout, unspecified: Secondary | ICD-10-CM | POA: Diagnosis not present

## 2016-05-29 ENCOUNTER — Encounter: Payer: Self-pay | Admitting: *Deleted

## 2016-05-31 ENCOUNTER — Telehealth: Payer: Self-pay

## 2016-05-31 DIAGNOSIS — Z1211 Encounter for screening for malignant neoplasm of colon: Secondary | ICD-10-CM | POA: Diagnosis not present

## 2016-05-31 DIAGNOSIS — I252 Old myocardial infarction: Secondary | ICD-10-CM | POA: Diagnosis not present

## 2016-05-31 NOTE — Telephone Encounter (Signed)
Received colonoscopy clearance from Dr.Magod's office requesting to hold Plavix prior to colonoscopy.Message sent to [place].

## 2016-06-01 NOTE — Telephone Encounter (Signed)
May hold Plavix for one week prior to colonoscopy. Continue ASA.  Peter [name] MD, [facility]

## 2016-06-04 NOTE — Telephone Encounter (Signed)
Spoke to patient 06/03/16 Dr.Jordan's recommendations given.

## 2016-06-07 NOTE — Telephone Encounter (Signed)
Note faxed to [facility] office at fax # [phone].

## 2016-06-11 DIAGNOSIS — D123 Benign neoplasm of transverse colon: Secondary | ICD-10-CM | POA: Diagnosis not present

## 2016-06-11 DIAGNOSIS — Z1211 Encounter for screening for malignant neoplasm of colon: Secondary | ICD-10-CM | POA: Diagnosis not present

## 2016-06-11 DIAGNOSIS — K648 Other hemorrhoids: Secondary | ICD-10-CM | POA: Diagnosis not present

## 2016-06-11 DIAGNOSIS — D126 Benign neoplasm of colon, unspecified: Secondary | ICD-10-CM | POA: Diagnosis not present

## 2016-06-11 DIAGNOSIS — K644 Residual hemorrhoidal skin tags: Secondary | ICD-10-CM | POA: Diagnosis not present

## 2016-06-11 DIAGNOSIS — D120 Benign neoplasm of cecum: Secondary | ICD-10-CM | POA: Diagnosis not present

## 2016-06-11 DIAGNOSIS — K635 Polyp of colon: Secondary | ICD-10-CM | POA: Diagnosis not present

## 2016-06-11 NOTE — Progress Notes (Addendum)
Cardiology Office Note   Date:  06/12/2016   ID:  Kevin Best, DOB 23-Jul-1946, MRN [id number]  PCP:  Gara Kroner, MD  Cardiologist:  Dr. [name]    Chief Complaint  Patient presents with  . Follow-up    6 MONTHS  . Fatigue      History of Present Illness: Kevin Best is a 70 y.o. male who presents for follow up of CAD. His past medical history of hypertension and obstructive sleep apnea on CPAP who did not have prior h/o CAD until he presented with inferolateral STEMI in August 2016.  Emergent cath lab and DES to RCA prox to mid. Single vessel disease.   On follow up today he denies any chest pain or DOE. He is not exercising and has gained weight. States he has no energy or motivation.  No chest pain, SOB, edema, or palpitations. Still wearing CPAP at night.    Past Medical History:  Diagnosis Date  . ALLERGIC RHINITIS   . CAD (coronary artery disease)    s/p inferolateral STEMI 05/23/2015 DES to prox-mid RCA, 100% occluded RPLB 2  . Environmental allergies   . Hyperlipidemia 05/24/2015  . Hypertension   . Prediabetes 05/25/2015  . Sleep apnea     Past Surgical History:  Procedure Laterality Date  . CARDIAC CATHETERIZATION N/A 05/23/2015   Procedure: Left Heart Cath and Coronary Angiography;  Surgeon:  [name], MD;  Location: [place] [facility];  Service: Cardiovascular;  Laterality: N/A;  . CARDIAC CATHETERIZATION  05/23/2015   Procedure: Coronary Stent Intervention;  Surgeon:  [name], MD;  Location: [place] [facility];  Service: Cardiovascular;;  . SPLENECTOMY, TOTAL  2005  . THROAT SURGERY    . UVULOPALATOPHARYNGOPLASTY       Current Outpatient Prescriptions  Medication Sig Dispense Refill  . aspirin EC 81 MG EC tablet Take 1 tablet (81 mg total) by mouth daily.    . Atorvastatin Calcium (LIPITOR PO) Take 1 tablet by mouth daily.    Marland Kitchen losartan (COZAAR) 100 MG tablet Take 100 mg by mouth daily.    . nitroGLYCERIN (NITROSTAT) 0.4 MG SL tablet Place  1 tablet (0.4 mg total) under the tongue every 5 (five) minutes x 3 doses as needed for chest pain. 25 tablet 3   No current facility-administered medications for this visit.     Allergies:   Review of patient's allergies indicates no known allergies.    Social History:  The patient  reports that he has never smoked. He does not have any smokeless tobacco history on file. He reports that he does not drink alcohol or use drugs.   Family History:  The patient's family history includes Melanoma in his father.    ROS: As noted in HPI. All other systems are reviewed and are negative.  Wt Readings from Last 3 Encounters:  06/12/16 (!) 312 lb (141.5 kg)  12/07/15 (!) 309 lb (140.2 kg)  08/10/15 299 lb 9.7 oz (135.9 kg)     PHYSICAL EXAM: VS:  BP 138/84   Pulse (!) 49   Ht 5\' 10"  (1.778 m)   Wt (!) 312 lb (141.5 kg)   BMI 44.77 kg/m  , BMI Body mass index is 44.77 kg/m. General:Pleasant affect, obese, NAD Skin:Warm and dry, brisk capillary refill HEENT:normocephalic, sclera clear, mucus membranes moist Neck:supple, no JVD, no bruits  Heart:S1S2 RRR without murmur, gallup, rub or click Lungs:clear- clear without rales, rhonchi, or wheezes [id number], soft, non tender, +  BS, do not palpate liver spleen or masses Ext:no lower ext edema, 2+ pedal pulses, 2+ radial pulses- cath site healed. Neuro:alert and oriented, MAE, follows commands, + facial symmetry    EKG:  EKG is ordered today. Sinus brady with rate 49. Old inferior infarct. I have personally reviewed and interpreted this study.     Recent Labs: 12/13/2015: ALT 19; BUN 15; Creat 1.38; Potassium 4.7; Sodium 140    Lipid Panel    Component Value Date/Time   CHOL 107 (L) 12/13/2015 0816   TRIG 140 12/13/2015 0816   HDL 37 (L) 12/13/2015 0816   CHOLHDL 2.9 12/13/2015 0816   VLDL 28 12/13/2015 0816   LDLCALC 42 12/13/2015 0816     Labs dated 05/06/16: Creatinine 1.21, other chemistries and TSH normal. A1c 5.6%.  Cholesterol 105, trig 126, HDL 37, LDL 43.   Other studies Reviewed: Additional studies/ records that were reviewed today include:    ASSESSMENT AND PLAN:  1.  ST elevation myocardial infarction (STEMI) of inferior wall-August 2016.  A Synergy DES to pRCA to mRCA. This was a large vessel.  Plan to stop Plaivx at this point. Needs to increase efforts at exercise and lose weight. Due to fatigue and bradycardia will stop metoprolol.  2. CAD- 2nd RPLB 100%- chronic 3. Obstructive sleep apnea 4.  HTN (hypertension)- controlled 5. Morbid obesity-  Encouraged more efforts at weight loss.  6.  Hyperlipidemia  Excellent control on lipitor.    Current medicines are reviewed with the patient today.  The patient Has no concerns regarding medicines.  The following changes have been made:  See above Labs/ tests ordered today include:see above  Disposition:   FU:  6 months.   Signed,  [name], MD  06/12/2016 10:50 AM    [place], [place], [place] Phone: [phone]; Fax: [phone]

## 2016-06-12 ENCOUNTER — Ambulatory Visit (INDEPENDENT_AMBULATORY_CARE_PROVIDER_SITE_OTHER): Payer: Medicare Other | Admitting: Cardiology

## 2016-06-12 ENCOUNTER — Encounter: Payer: Self-pay | Admitting: Cardiology

## 2016-06-12 VITALS — BP 138/84 | HR 49 | Ht 70.0 in | Wt 312.0 lb

## 2016-06-12 DIAGNOSIS — E785 Hyperlipidemia, unspecified: Secondary | ICD-10-CM | POA: Diagnosis not present

## 2016-06-12 DIAGNOSIS — I2119 ST elevation (STEMI) myocardial infarction involving other coronary artery of inferior wall: Secondary | ICD-10-CM | POA: Diagnosis not present

## 2016-06-12 DIAGNOSIS — I10 Essential (primary) hypertension: Secondary | ICD-10-CM | POA: Diagnosis not present

## 2016-06-12 DIAGNOSIS — I2510 Atherosclerotic heart disease of native coronary artery without angina pectoris: Secondary | ICD-10-CM | POA: Diagnosis not present

## 2016-06-12 DIAGNOSIS — G4733 Obstructive sleep apnea (adult) (pediatric): Secondary | ICD-10-CM

## 2016-06-12 NOTE — Patient Instructions (Addendum)
Stop taking metoprolol and Plavix.   Start walking-- NO EXCUSES  I will see you in 6 months.

## 2016-06-18 DIAGNOSIS — D126 Benign neoplasm of colon, unspecified: Secondary | ICD-10-CM | POA: Diagnosis not present

## 2016-06-18 DIAGNOSIS — Z1211 Encounter for screening for malignant neoplasm of colon: Secondary | ICD-10-CM | POA: Diagnosis not present

## 2016-06-18 DIAGNOSIS — K635 Polyp of colon: Secondary | ICD-10-CM | POA: Diagnosis not present

## 2016-07-11 DIAGNOSIS — B9789 Other viral agents as the cause of diseases classified elsewhere: Secondary | ICD-10-CM | POA: Diagnosis not present

## 2016-07-11 DIAGNOSIS — J069 Acute upper respiratory infection, unspecified: Secondary | ICD-10-CM | POA: Diagnosis not present

## 2016-07-11 DIAGNOSIS — R350 Frequency of micturition: Secondary | ICD-10-CM | POA: Diagnosis not present

## 2016-08-08 DIAGNOSIS — L0390 Cellulitis, unspecified: Secondary | ICD-10-CM | POA: Diagnosis not present

## 2016-08-08 DIAGNOSIS — S31139A Puncture wound of abdominal wall without foreign body, unspecified quadrant without penetration into peritoneal cavity, initial encounter: Secondary | ICD-10-CM | POA: Diagnosis not present

## 2016-11-07 DIAGNOSIS — Z125 Encounter for screening for malignant neoplasm of prostate: Secondary | ICD-10-CM | POA: Diagnosis not present

## 2016-11-07 DIAGNOSIS — Z0000 Encounter for general adult medical examination without abnormal findings: Secondary | ICD-10-CM | POA: Diagnosis not present

## 2016-11-07 DIAGNOSIS — I10 Essential (primary) hypertension: Secondary | ICD-10-CM | POA: Diagnosis not present

## 2016-11-07 DIAGNOSIS — M109 Gout, unspecified: Secondary | ICD-10-CM | POA: Diagnosis not present

## 2016-11-07 DIAGNOSIS — D751 Secondary polycythemia: Secondary | ICD-10-CM | POA: Diagnosis not present

## 2016-11-07 DIAGNOSIS — I252 Old myocardial infarction: Secondary | ICD-10-CM | POA: Diagnosis not present

## 2016-11-07 DIAGNOSIS — Q8901 Asplenia (congenital): Secondary | ICD-10-CM | POA: Diagnosis not present

## 2016-11-07 DIAGNOSIS — L570 Actinic keratosis: Secondary | ICD-10-CM | POA: Diagnosis not present

## 2016-11-07 DIAGNOSIS — I2510 Atherosclerotic heart disease of native coronary artery without angina pectoris: Secondary | ICD-10-CM | POA: Diagnosis not present

## 2016-11-07 DIAGNOSIS — N529 Male erectile dysfunction, unspecified: Secondary | ICD-10-CM | POA: Diagnosis not present

## 2016-11-07 DIAGNOSIS — G4733 Obstructive sleep apnea (adult) (pediatric): Secondary | ICD-10-CM | POA: Diagnosis not present

## 2016-11-07 DIAGNOSIS — Z1389 Encounter for screening for other disorder: Secondary | ICD-10-CM | POA: Diagnosis not present

## 2016-11-29 ENCOUNTER — Encounter: Payer: Self-pay | Admitting: Cardiology

## 2016-12-02 DIAGNOSIS — Z1283 Encounter for screening for malignant neoplasm of skin: Secondary | ICD-10-CM | POA: Diagnosis not present

## 2016-12-02 DIAGNOSIS — L821 Other seborrheic keratosis: Secondary | ICD-10-CM | POA: Diagnosis not present

## 2016-12-06 NOTE — Progress Notes (Signed)
Cardiology Office Note   Date:  12/09/2016   ID:  Kevin Best, DOB 07-23-1946, MRN [id number]  PCP:  Gara Kroner, MD  Cardiologist:  Dr. [name]    Chief Complaint  Patient presents with  . Follow-up    6 months;  . Coronary Artery Disease       History of Present Illness: Kevin Best is a 71 y.o. male who presents for follow up of CAD. His past medical history of hypertension and obstructive sleep apnea on CPAP who did not have prior h/o CAD until he presented with inferolateral STEMI in August 2016.  Emergent cath lab and DES to RCA prox to mid. Single vessel disease.   On follow up today he denies any chest pain or DOE. He is not exercising as much in the last 6 weeks and has gained weight- 6 lbs. Enjoys playing checkers competitively.  No chest pain, SOB, edema, or palpitations. Still wearing CPAP at night.    Past Medical History:  Diagnosis Date  . ALLERGIC RHINITIS   . CAD (coronary artery disease)    s/p inferolateral STEMI 05/23/2015 DES to prox-mid RCA, 100% occluded RPLB 2  . Environmental allergies   . Hyperlipidemia 05/24/2015  . Hypertension   . Prediabetes 05/25/2015  . Sleep apnea     Past Surgical History:  Procedure Laterality Date  . CARDIAC CATHETERIZATION N/A 05/23/2015   Procedure: Left Heart Cath and Coronary Angiography;  Surgeon: Peter [name], MD;  Location: [place] [facility];  Service: Cardiovascular;  Laterality: N/A;  . CARDIAC CATHETERIZATION  05/23/2015   Procedure: Coronary Stent Intervention;  Surgeon: Peter [name], MD;  Location: [place] [facility];  Service: Cardiovascular;;  . SPLENECTOMY, TOTAL  2005  . THROAT SURGERY    . UVULOPALATOPHARYNGOPLASTY       Current Outpatient Prescriptions  Medication Sig Dispense Refill  . aspirin EC 81 MG EC tablet Take 1 tablet (81 mg total) by mouth daily.    Marland Kitchen atorvastatin (LIPITOR) 40 MG tablet 1 tablet    . losartan (COZAAR) 100 MG tablet Take 100 mg by mouth daily.    . nitroGLYCERIN  (NITROSTAT) 0.4 MG SL tablet Place 1 tablet (0.4 mg total) under the tongue every 5 (five) minutes x 3 doses as needed for chest pain. 25 tablet 3   No current facility-administered medications for this visit.     Allergies:   Amlodipine besylate    Social History:  The patient  reports that he has never smoked. He has never used smokeless tobacco. He reports that he does not drink alcohol or use drugs.   Family History:  The patient's family history includes Melanoma in his father.    ROS: As noted in HPI. All other systems are reviewed and are negative.  Wt Readings from Last 3 Encounters:  12/09/16 (!) 318 lb 12.8 oz (144.6 kg)  06/12/16 (!) 312 lb (141.5 kg)  12/07/15 (!) 309 lb (140.2 kg)     PHYSICAL EXAM: VS:  BP 140/77   Pulse 81   Ht 5\' 10"  (1.778 m)   Wt (!) 318 lb 12.8 oz (144.6 kg)   BMI 45.74 kg/m  , BMI Body mass index is 45.74 kg/m. General:Pleasant affect, obese, NAD Skin:Warm and dry, brisk capillary refill HEENT:normocephalic, sclera clear, mucus membranes moist Neck:supple, no JVD, no bruits  Heart:S1S2 RRR without murmur, gallup, rub or click Lungs:clear- clear without rales, rhonchi, or wheezes [id number], soft, non tender, + BS, do  not palpate liver spleen or masses Ext:no lower ext edema, 2+ pedal pulses, 2+ radial pulses- cath site healed. Neuro:alert and oriented, MAE, follows commands, + facial symmetry    EKG:  EKG is  Not ordered today.    Recent Labs: 12/13/2015: ALT 19; BUN 15; Creat 1.38; Potassium 4.7; Sodium 140    Lipid Panel    Component Value Date/Time   CHOL 107 (L) 12/13/2015 0816   TRIG 140 12/13/2015 0816   HDL 37 (L) 12/13/2015 0816   CHOLHDL 2.9 12/13/2015 0816   VLDL 28 12/13/2015 0816   LDLCALC 42 12/13/2015 0816     Labs dated 05/06/16: Creatinine 1.21, other chemistries and TSH normal. A1c 5.6%. Cholesterol 105, trig 126, HDL 37, LDL 43.   Other studies Reviewed: Additional studies/ records that were reviewed  today include:    ASSESSMENT AND PLAN:  1. CAD s/p  ST elevation myocardial infarction (STEMI) of inferior wall-August 2016.  A Synergy DES to pRCA to mRCA. This was a large vessel.   Needs to increase efforts at exercise and lose weight. Metoprolol previously held due to fatigue and bradycardia. No angina.  2. CAD- 2nd RPLB 100%- chronic 3. Obstructive sleep apnea on CPAP. 4.  HTN (hypertension)- controlled 5. Morbid obesity-  Encouraged more efforts at weight loss.  6.  Hyperlipidemia  Excellent control on lipitor.    Current medicines are reviewed with the patient today.  The patient Has no concerns regarding medicines.  The following changes have been made:  See above Labs/ tests ordered today include:see above  Disposition:   FU:  One year   Signed, Peter [name], MD  12/09/2016 12:08 PM    [facility] [place], [place], [place] Phone: [phone]; Fax: [phone]

## 2016-12-09 ENCOUNTER — Encounter: Payer: Self-pay | Admitting: Cardiology

## 2016-12-09 ENCOUNTER — Ambulatory Visit (INDEPENDENT_AMBULATORY_CARE_PROVIDER_SITE_OTHER): Payer: Medicare Other | Admitting: Cardiology

## 2016-12-09 VITALS — BP 140/77 | HR 81 | Ht 70.0 in | Wt 318.8 lb

## 2016-12-09 DIAGNOSIS — I2510 Atherosclerotic heart disease of native coronary artery without angina pectoris: Secondary | ICD-10-CM | POA: Diagnosis not present

## 2016-12-09 DIAGNOSIS — I10 Essential (primary) hypertension: Secondary | ICD-10-CM

## 2016-12-09 DIAGNOSIS — E7800 Pure hypercholesterolemia, unspecified: Secondary | ICD-10-CM | POA: Diagnosis not present

## 2016-12-09 NOTE — Patient Instructions (Addendum)
You need to focus on increasing your aerobic activity and weight loss.  Continue your current medications  I will see you in one year

## 2017-02-26 DIAGNOSIS — M549 Dorsalgia, unspecified: Secondary | ICD-10-CM | POA: Diagnosis not present

## 2017-02-26 DIAGNOSIS — Z789 Other specified health status: Secondary | ICD-10-CM | POA: Diagnosis not present

## 2017-02-26 DIAGNOSIS — R5383 Other fatigue: Secondary | ICD-10-CM | POA: Diagnosis not present

## 2017-02-26 DIAGNOSIS — M25561 Pain in right knee: Secondary | ICD-10-CM | POA: Diagnosis not present

## 2017-05-08 DIAGNOSIS — I252 Old myocardial infarction: Secondary | ICD-10-CM | POA: Diagnosis not present

## 2017-05-08 DIAGNOSIS — E7800 Pure hypercholesterolemia, unspecified: Secondary | ICD-10-CM | POA: Diagnosis not present

## 2017-05-08 DIAGNOSIS — I10 Essential (primary) hypertension: Secondary | ICD-10-CM | POA: Diagnosis not present

## 2017-05-08 DIAGNOSIS — G4733 Obstructive sleep apnea (adult) (pediatric): Secondary | ICD-10-CM | POA: Diagnosis not present

## 2017-05-08 DIAGNOSIS — M109 Gout, unspecified: Secondary | ICD-10-CM | POA: Diagnosis not present

## 2017-05-08 DIAGNOSIS — Z6841 Body Mass Index (BMI) 40.0 and over, adult: Secondary | ICD-10-CM | POA: Diagnosis not present

## 2017-05-08 DIAGNOSIS — Q8901 Asplenia (congenital): Secondary | ICD-10-CM | POA: Diagnosis not present

## 2017-05-08 DIAGNOSIS — R7301 Impaired fasting glucose: Secondary | ICD-10-CM | POA: Diagnosis not present

## 2017-05-08 DIAGNOSIS — I2510 Atherosclerotic heart disease of native coronary artery without angina pectoris: Secondary | ICD-10-CM | POA: Diagnosis not present

## 2017-05-08 DIAGNOSIS — N529 Male erectile dysfunction, unspecified: Secondary | ICD-10-CM | POA: Diagnosis not present

## 2017-05-08 DIAGNOSIS — D751 Secondary polycythemia: Secondary | ICD-10-CM | POA: Diagnosis not present

## 2017-07-30 DIAGNOSIS — B349 Viral infection, unspecified: Secondary | ICD-10-CM | POA: Diagnosis not present

## 2017-07-30 DIAGNOSIS — J0100 Acute maxillary sinusitis, unspecified: Secondary | ICD-10-CM | POA: Diagnosis not present

## 2017-08-05 IMAGING — CR DG CHEST 1V PORT
2 series · 2 of 2 positions shown · non-contrast
Comparison: 03/22/2004

CLINICAL DATA: Chest pain after cardiac catheterization

EXAM:
PORTABLE CHEST - 1 VIEW

[AP (1 of 2)]
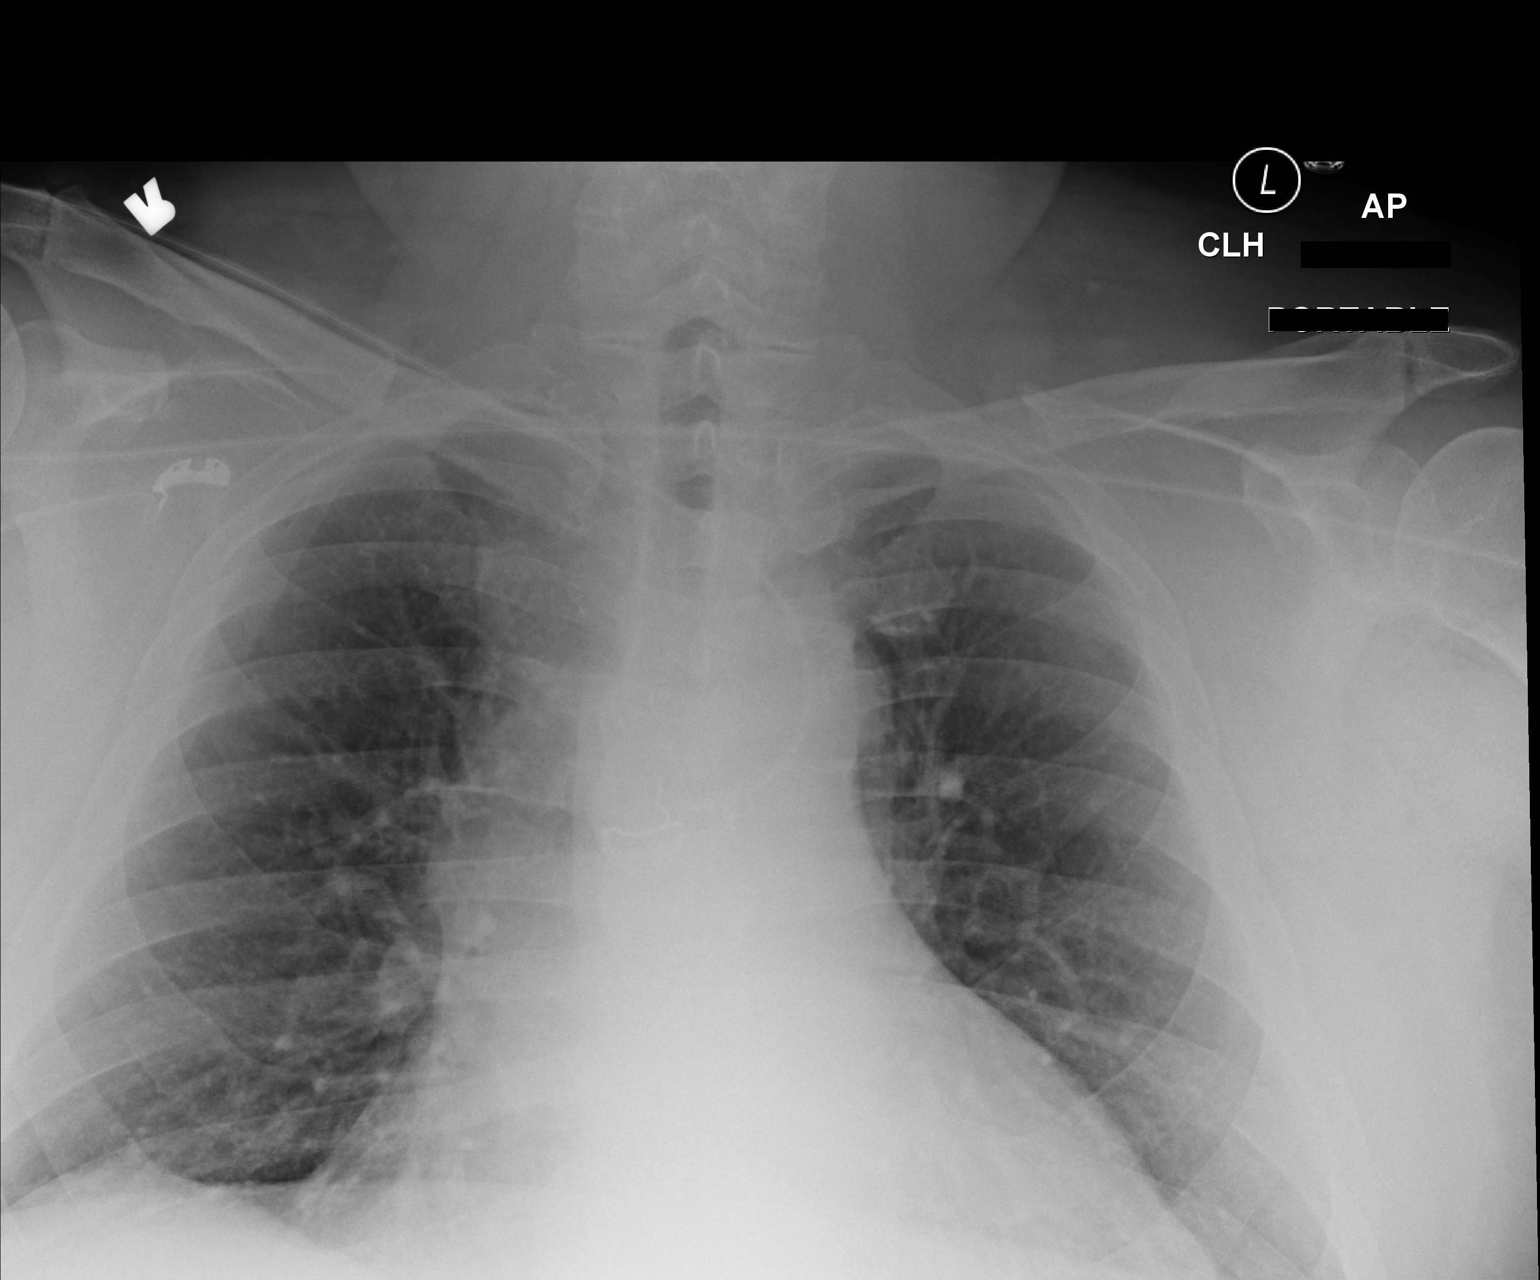

[AP (2 of 2)]
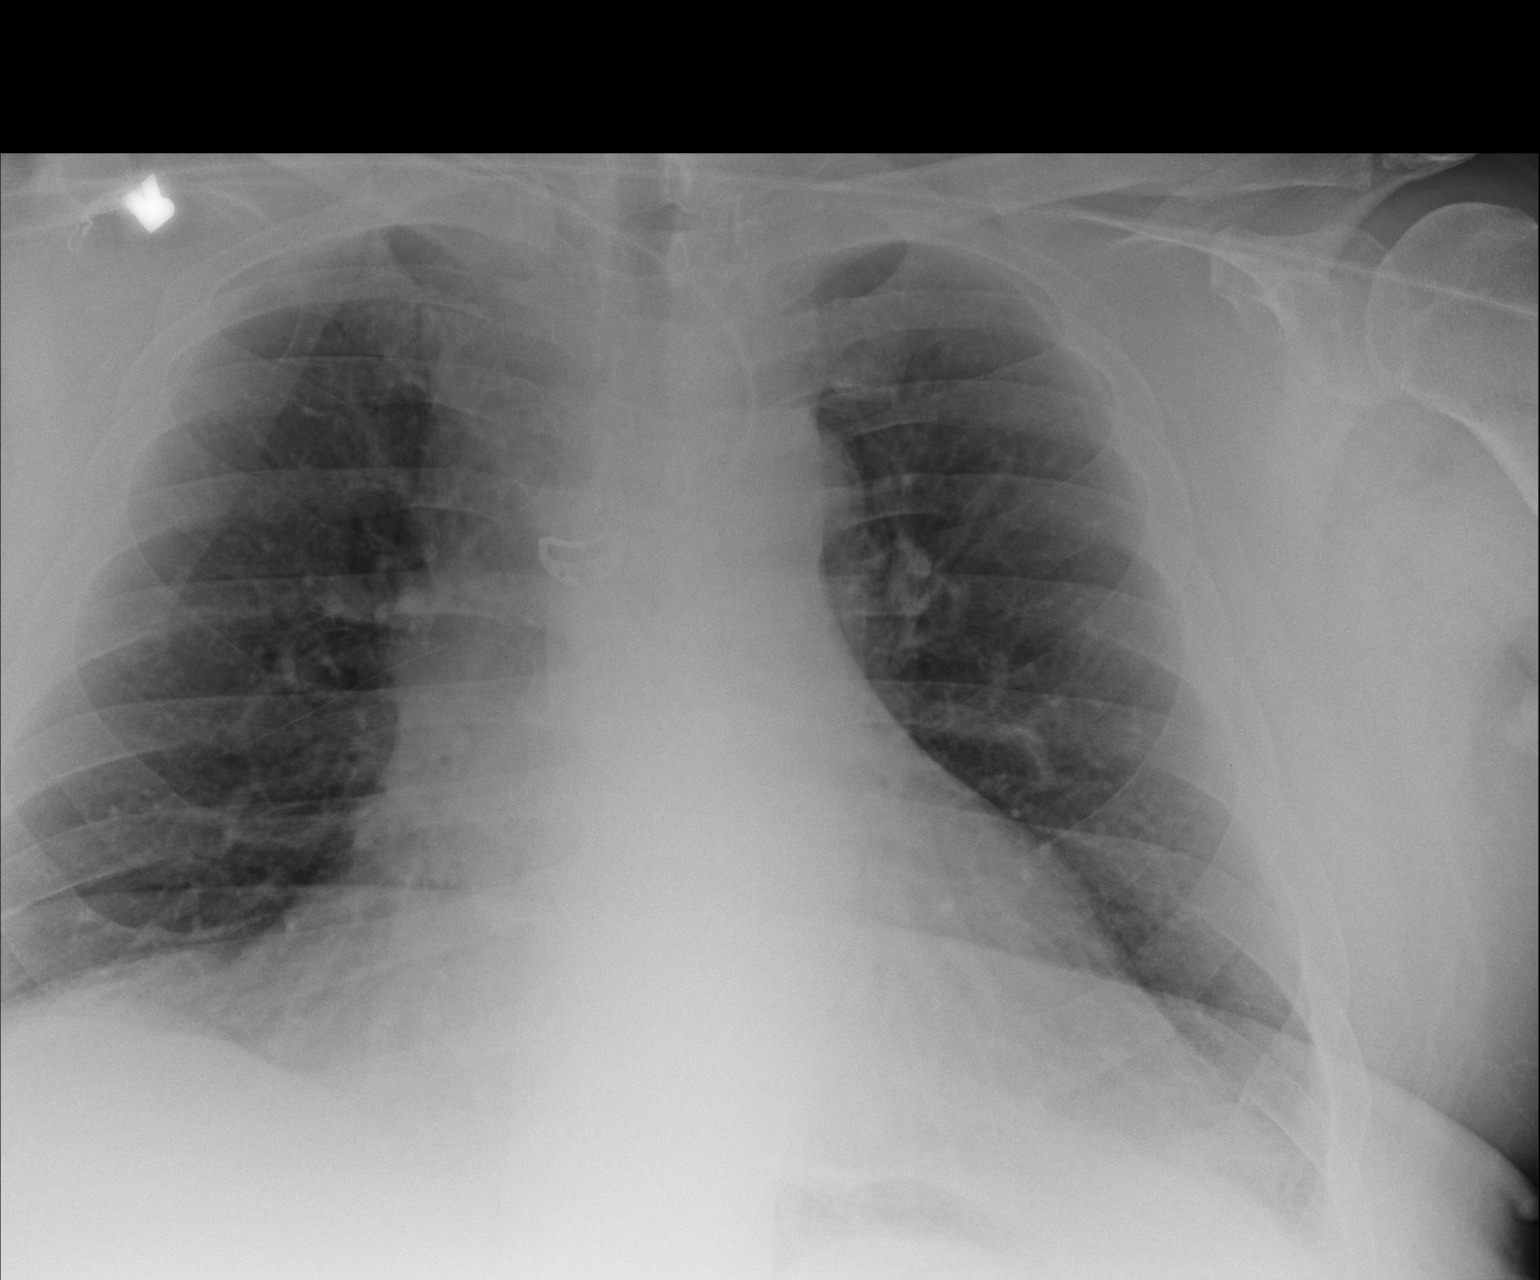

[2 of 2 positions shown; findings below may reference images not displayed]

FINDINGS: Cardiac silhouette enlargement of moderate severity, stable.
Vascular pattern normal. Lungs clear although right costophrenic
angle is excluded from the image.
IMPRESSION: Study limited by exclusion of a portion of the inferolateral right
lung. Otherwise no acute findings.

## 2017-09-29 DIAGNOSIS — J069 Acute upper respiratory infection, unspecified: Secondary | ICD-10-CM | POA: Diagnosis not present

## 2017-10-04 DIAGNOSIS — N400 Enlarged prostate without lower urinary tract symptoms: Secondary | ICD-10-CM | POA: Diagnosis not present

## 2017-10-04 DIAGNOSIS — T887XXA Unspecified adverse effect of drug or medicament, initial encounter: Secondary | ICD-10-CM | POA: Diagnosis not present

## 2017-10-04 DIAGNOSIS — R8290 Unspecified abnormal findings in urine: Secondary | ICD-10-CM | POA: Diagnosis not present

## 2017-10-04 DIAGNOSIS — R338 Other retention of urine: Secondary | ICD-10-CM | POA: Diagnosis not present

## 2017-10-04 DIAGNOSIS — N329 Bladder disorder, unspecified: Secondary | ICD-10-CM | POA: Diagnosis not present

## 2017-10-23 DIAGNOSIS — R338 Other retention of urine: Secondary | ICD-10-CM | POA: Diagnosis not present

## 2018-05-15 ENCOUNTER — Other Ambulatory Visit: Payer: Self-pay

## 2018-05-15 ENCOUNTER — Other Ambulatory Visit: Payer: Self-pay | Admitting: *Deleted

## 2018-05-15 MED ORDER — NITROGLYCERIN 0.4 MG SL SUBL: 0.4000 mg | SUBLINGUAL_TABLET | SUBLINGUAL | 0 refills | Status: AC | PRN

## 2018-05-15 MED ORDER — NITROGLYCERIN 0.4 MG SL SUBL: 0.4000 mg | SUBLINGUAL_TABLET | SUBLINGUAL | 0 refills | Status: DC | PRN

## 2018-05-15 NOTE — Telephone Encounter (Signed)
Rx request sent to pharmacy.  

## 2018-06-11 DIAGNOSIS — Z6841 Body Mass Index (BMI) 40.0 and over, adult: Secondary | ICD-10-CM | POA: Diagnosis not present

## 2018-06-11 DIAGNOSIS — M5431 Sciatica, right side: Secondary | ICD-10-CM | POA: Diagnosis not present

## 2018-07-30 DIAGNOSIS — D751 Secondary polycythemia: Secondary | ICD-10-CM | POA: Diagnosis not present

## 2018-07-30 DIAGNOSIS — M25561 Pain in right knee: Secondary | ICD-10-CM | POA: Diagnosis not present

## 2018-07-30 DIAGNOSIS — G4733 Obstructive sleep apnea (adult) (pediatric): Secondary | ICD-10-CM | POA: Diagnosis not present

## 2018-07-30 DIAGNOSIS — E7800 Pure hypercholesterolemia, unspecified: Secondary | ICD-10-CM | POA: Diagnosis not present

## 2018-07-30 DIAGNOSIS — I10 Essential (primary) hypertension: Secondary | ICD-10-CM | POA: Diagnosis not present

## 2018-07-30 DIAGNOSIS — Z1159 Encounter for screening for other viral diseases: Secondary | ICD-10-CM | POA: Diagnosis not present

## 2018-07-30 DIAGNOSIS — I2510 Atherosclerotic heart disease of native coronary artery without angina pectoris: Secondary | ICD-10-CM | POA: Diagnosis not present

## 2018-07-30 DIAGNOSIS — I252 Old myocardial infarction: Secondary | ICD-10-CM | POA: Diagnosis not present

## 2018-07-30 DIAGNOSIS — Z0000 Encounter for general adult medical examination without abnormal findings: Secondary | ICD-10-CM | POA: Diagnosis not present

## 2018-07-30 DIAGNOSIS — N529 Male erectile dysfunction, unspecified: Secondary | ICD-10-CM | POA: Diagnosis not present

## 2018-07-30 DIAGNOSIS — Q8901 Asplenia (congenital): Secondary | ICD-10-CM | POA: Diagnosis not present

## 2018-07-30 DIAGNOSIS — M109 Gout, unspecified: Secondary | ICD-10-CM | POA: Diagnosis not present

## 2019-01-29 DIAGNOSIS — G4733 Obstructive sleep apnea (adult) (pediatric): Secondary | ICD-10-CM | POA: Diagnosis not present

## 2019-01-29 DIAGNOSIS — I10 Essential (primary) hypertension: Secondary | ICD-10-CM | POA: Diagnosis not present

## 2019-01-29 DIAGNOSIS — E7800 Pure hypercholesterolemia, unspecified: Secondary | ICD-10-CM | POA: Diagnosis not present

## 2019-01-29 DIAGNOSIS — R7303 Prediabetes: Secondary | ICD-10-CM | POA: Diagnosis not present

## 2019-01-29 DIAGNOSIS — N529 Male erectile dysfunction, unspecified: Secondary | ICD-10-CM | POA: Diagnosis not present

## 2019-01-29 DIAGNOSIS — D751 Secondary polycythemia: Secondary | ICD-10-CM | POA: Diagnosis not present

## 2019-01-29 DIAGNOSIS — I2510 Atherosclerotic heart disease of native coronary artery without angina pectoris: Secondary | ICD-10-CM | POA: Diagnosis not present

## 2019-01-29 DIAGNOSIS — Q8901 Asplenia (congenital): Secondary | ICD-10-CM | POA: Diagnosis not present

## 2019-01-29 DIAGNOSIS — M109 Gout, unspecified: Secondary | ICD-10-CM | POA: Diagnosis not present

## 2019-01-29 DIAGNOSIS — I252 Old myocardial infarction: Secondary | ICD-10-CM | POA: Diagnosis not present

## 2019-08-11 DIAGNOSIS — E7800 Pure hypercholesterolemia, unspecified: Secondary | ICD-10-CM | POA: Diagnosis not present

## 2019-08-11 DIAGNOSIS — R7303 Prediabetes: Secondary | ICD-10-CM | POA: Diagnosis not present

## 2019-08-11 DIAGNOSIS — M109 Gout, unspecified: Secondary | ICD-10-CM | POA: Diagnosis not present

## 2019-08-16 DIAGNOSIS — I10 Essential (primary) hypertension: Secondary | ICD-10-CM | POA: Diagnosis not present

## 2019-08-16 DIAGNOSIS — Z7189 Other specified counseling: Secondary | ICD-10-CM | POA: Diagnosis not present

## 2019-08-16 DIAGNOSIS — M109 Gout, unspecified: Secondary | ICD-10-CM | POA: Diagnosis not present

## 2019-08-16 DIAGNOSIS — N529 Male erectile dysfunction, unspecified: Secondary | ICD-10-CM | POA: Diagnosis not present

## 2019-08-16 DIAGNOSIS — Z0000 Encounter for general adult medical examination without abnormal findings: Secondary | ICD-10-CM | POA: Diagnosis not present

## 2019-08-16 DIAGNOSIS — I2510 Atherosclerotic heart disease of native coronary artery without angina pectoris: Secondary | ICD-10-CM | POA: Diagnosis not present

## 2019-08-16 DIAGNOSIS — R7303 Prediabetes: Secondary | ICD-10-CM | POA: Diagnosis not present

## 2019-08-16 DIAGNOSIS — E7800 Pure hypercholesterolemia, unspecified: Secondary | ICD-10-CM | POA: Diagnosis not present

## 2019-08-16 DIAGNOSIS — Z1389 Encounter for screening for other disorder: Secondary | ICD-10-CM | POA: Diagnosis not present

## 2020-02-29 DIAGNOSIS — E7800 Pure hypercholesterolemia, unspecified: Secondary | ICD-10-CM | POA: Diagnosis not present

## 2020-02-29 DIAGNOSIS — D751 Secondary polycythemia: Secondary | ICD-10-CM | POA: Diagnosis not present

## 2020-02-29 DIAGNOSIS — M109 Gout, unspecified: Secondary | ICD-10-CM | POA: Diagnosis not present

## 2020-02-29 DIAGNOSIS — I2510 Atherosclerotic heart disease of native coronary artery without angina pectoris: Secondary | ICD-10-CM | POA: Diagnosis not present

## 2020-02-29 DIAGNOSIS — I10 Essential (primary) hypertension: Secondary | ICD-10-CM | POA: Diagnosis not present

## 2020-02-29 DIAGNOSIS — G4733 Obstructive sleep apnea (adult) (pediatric): Secondary | ICD-10-CM | POA: Diagnosis not present

## 2020-02-29 DIAGNOSIS — I252 Old myocardial infarction: Secondary | ICD-10-CM | POA: Diagnosis not present

## 2020-02-29 DIAGNOSIS — N529 Male erectile dysfunction, unspecified: Secondary | ICD-10-CM | POA: Diagnosis not present

## 2020-02-29 DIAGNOSIS — R7303 Prediabetes: Secondary | ICD-10-CM | POA: Diagnosis not present

## 2020-04-10 DIAGNOSIS — W57XXXA Bitten or stung by nonvenomous insect and other nonvenomous arthropods, initial encounter: Secondary | ICD-10-CM | POA: Diagnosis not present

## 2020-04-10 DIAGNOSIS — S00462A Insect bite (nonvenomous) of left ear, initial encounter: Secondary | ICD-10-CM | POA: Diagnosis not present

## 2020-05-13 DIAGNOSIS — Z03818 Encounter for observation for suspected exposure to other biological agents ruled out: Secondary | ICD-10-CM | POA: Diagnosis not present

## 2020-05-24 DIAGNOSIS — H353131 Nonexudative age-related macular degeneration, bilateral, early dry stage: Secondary | ICD-10-CM | POA: Diagnosis not present

## 2020-08-21 DIAGNOSIS — R202 Paresthesia of skin: Secondary | ICD-10-CM | POA: Diagnosis not present

## 2020-08-21 DIAGNOSIS — R7303 Prediabetes: Secondary | ICD-10-CM | POA: Diagnosis not present

## 2020-08-21 DIAGNOSIS — I10 Essential (primary) hypertension: Secondary | ICD-10-CM | POA: Diagnosis not present

## 2020-08-21 DIAGNOSIS — E7800 Pure hypercholesterolemia, unspecified: Secondary | ICD-10-CM | POA: Diagnosis not present

## 2020-08-21 DIAGNOSIS — N529 Male erectile dysfunction, unspecified: Secondary | ICD-10-CM | POA: Diagnosis not present

## 2020-08-21 DIAGNOSIS — Z23 Encounter for immunization: Secondary | ICD-10-CM | POA: Diagnosis not present

## 2020-08-21 DIAGNOSIS — M109 Gout, unspecified: Secondary | ICD-10-CM | POA: Diagnosis not present

## 2020-08-21 DIAGNOSIS — Z0000 Encounter for general adult medical examination without abnormal findings: Secondary | ICD-10-CM | POA: Diagnosis not present

## 2020-08-21 DIAGNOSIS — I2510 Atherosclerotic heart disease of native coronary artery without angina pectoris: Secondary | ICD-10-CM | POA: Diagnosis not present

## 2020-08-21 DIAGNOSIS — G4733 Obstructive sleep apnea (adult) (pediatric): Secondary | ICD-10-CM | POA: Diagnosis not present

## 2020-08-21 DIAGNOSIS — Z1389 Encounter for screening for other disorder: Secondary | ICD-10-CM | POA: Diagnosis not present

## 2020-11-02 DIAGNOSIS — R221 Localized swelling, mass and lump, neck: Secondary | ICD-10-CM | POA: Diagnosis not present

## 2020-11-08 ENCOUNTER — Other Ambulatory Visit: Payer: Self-pay | Admitting: Family Medicine

## 2020-11-08 DIAGNOSIS — K116 Mucocele of salivary gland: Secondary | ICD-10-CM

## 2020-11-08 DIAGNOSIS — R9389 Abnormal findings on diagnostic imaging of other specified body structures: Secondary | ICD-10-CM

## 2020-11-08 DIAGNOSIS — R221 Localized swelling, mass and lump, neck: Secondary | ICD-10-CM

## 2020-11-30 ENCOUNTER — Other Ambulatory Visit: Payer: Self-pay

## 2020-11-30 ENCOUNTER — Ambulatory Visit
Admission: RE | Admit: 2020-11-30 | Discharge: 2020-11-30 | Disposition: A | Discharge status: Home / Self Care | Payer: Medicare Other | Source: Ambulatory Visit | Attending: Family Medicine | Admitting: Family Medicine

## 2020-11-30 DIAGNOSIS — K116 Mucocele of salivary gland: Secondary | ICD-10-CM

## 2020-11-30 DIAGNOSIS — E041 Nontoxic single thyroid nodule: Secondary | ICD-10-CM | POA: Diagnosis not present

## 2020-11-30 DIAGNOSIS — R221 Localized swelling, mass and lump, neck: Secondary | ICD-10-CM

## 2020-11-30 DIAGNOSIS — R9389 Abnormal findings on diagnostic imaging of other specified body structures: Secondary | ICD-10-CM

## 2020-11-30 MED ORDER — IOPAMIDOL (ISOVUE-300) INJECTION 61%
75.0000 mL | Freq: Once | INTRAVENOUS | Status: AC | PRN
  Administered 2020-11-30: 75 mL via INTRAVENOUS

## 2020-12-15 NOTE — Progress Notes (Signed)
Cardiology Office Note   Date:  12/18/2020   ID:  Kevin Best, DOB Jul 12, 1946, MRN [id number]  PCP:  Kevin Contras, MD  Cardiologist:    [name], MD   Chief Complaint  Patient presents with  . New Patient (Initial Visit)  . Coronary Artery Disease      History of Present Illness: Kevin Best is a 75 y.o. male who is seen at the request of Dr Moreen Fowler for evaluation of CAD. He has a history of HTN, HLD, morbid obesity with OSA. He was last seen in March 2018. He presented with inferolateral STEMI in August 2016.  Emergent cath lab and DES to RCA prox to mid. Single vessel disease. There was embolic occlusion of a PL2 branch.   On follow up today he is doing well from a cardiac standpoint. Denies any chest pain or dyspnea. Does note some stiffness with walking but does OK once he gets warmed up. Recently he had a swelling in his right neck. Was treated with antibiotics with improvement. Had CT done which showed incidental finding of left carotid stenosis. He denies any symptoms of TIA or stroke. Does have mild memory loss.     Past Medical History:  Diagnosis Date  . ALLERGIC RHINITIS   . CAD (coronary artery disease)    s/p inferolateral STEMI 05/23/2015 DES to prox-mid RCA, 100% occluded RPLB 2  . Environmental allergies   . Hyperlipidemia 05/24/2015  . Hypertension   . Prediabetes 05/25/2015  . Sleep apnea     Past Surgical History:  Procedure Laterality Date  . CARDIAC CATHETERIZATION N/A 05/23/2015   Procedure: Left Heart Cath and Coronary Angiography;  Surgeon:  [name], MD;  Location: [facility];  Service: Cardiovascular;  Laterality: N/A;  . CARDIAC CATHETERIZATION  05/23/2015   Procedure: Coronary Stent Intervention;  Surgeon:  [name], MD;  Location: [facility];  Service: Cardiovascular;;  . SPLENECTOMY, TOTAL  2005  . THROAT SURGERY    . UVULOPALATOPHARYNGOPLASTY       Current Outpatient Medications  Medication Sig Dispense  Refill  . aspirin EC 81 MG EC tablet Take 1 tablet (81 mg total) by mouth daily.    Marland Kitchen atorvastatin (LIPITOR) 40 MG tablet 1 tablet    . losartan (COZAAR) 100 MG tablet Take 100 mg by mouth daily.    . nitroGLYCERIN (NITROSTAT) 0.4 MG SL tablet Place 1 tablet (0.4 mg total) under the tongue every 5 (five) minutes x 3 doses as needed for chest pain. Need appointment. 25 tablet 0   No current facility-administered medications for this visit.    Allergies:   Amlodipine besylate    Social History:  The patient  reports that he has never smoked. He has never used smokeless tobacco. He reports that he does not drink alcohol and does not use drugs.   Family History:  The patient's family history includes Melanoma in his father.    ROS:  Please see the history of present illness.   Otherwise, review of systems are positive for none.   All other systems are reviewed and negative.    PHYSICAL EXAM: VS:  BP 126/80 (BP Location: Right Arm, Patient Position: Sitting, Cuff Size: Large)   Pulse (!) 58   Ht 5\' 9"  (1.753 m)   Wt 288 lb (130.6 kg)   BMI 42.53 kg/m  , BMI Body mass index is 42.53 kg/m. GEN: Well nourished, obese, in no acute distress  HEENT: normal  Neck: no JVD, carotid bruits, or masses Cardiac: RRR; no murmurs, rubs, or gallops,no edema  Respiratory:  clear to auscultation bilaterally, normal work of breathing GI: soft, nontender, nondistended, + BS MS: no deformity or atrophy  Skin: warm and dry, no rash Neuro:  Strength and sensation are intact Psych: euthymic mood, full affect   EKG:  EKG is ordered today. The ekg ordered today demonstrates NSR with old inferior infarct. Poor R wave progression. Unchanged from 2017. I have personally reviewed and interpreted this study.    Recent Labs: No results found for requested labs within last 8760 hours.    Lipid Panel    Component Value Date/Time   CHOL 107 (L) 12/13/2015 0816   TRIG 140 12/13/2015 0816   HDL 37 (L)  12/13/2015 0816   CHOLHDL 2.9 12/13/2015 0816   VLDL 28 12/13/2015 0816   LDLCALC 42 12/13/2015 0816    Dated 08/21/20: cholesterol 104, triglycerides 146, HDL 44, LDL 34. A1c 5.5%. creatinine 1.12. CMET and TSH normal Dated 11/02/20 normal CBC  Wt Readings from Last 3 Encounters:  12/18/20 288 lb (130.6 kg)  12/09/16 (!) 318 lb 12.8 oz (144.6 kg)  06/12/16 (!) 312 lb (141.5 kg)      Other studies Reviewed: Additional studies/ records that were reviewed today include:   Echo 05/24/15: Study Conclusions   - Left ventricle: The cavity size was normal. Wall thickness was  increased in a pattern of mild LVH. Systolic function was normal.  The estimated ejection fraction was in the range of 60% to 65%.  Wall motion was normal; there were no regional wall motion  abnormalities. Doppler parameters are consistent with abnormal  left ventricular relaxation (grade 1 diastolic dysfunction).  - Aortic valve: There was no stenosis.  - Aorta: Ascending aortic diameter: 38 mm (S).  - Ascending aorta: The ascending aorta was mildly dilated.  - Mitral valve: There was no significant regurgitation.  - Right ventricle: Poorly visualized. The cavity size was normal.  Systolic function was normal.  - Pulmonary arteries: No complete TR doppler jet so unable to  estimate PA systolic pressure.  - Systemic veins: IVC measured 2.5 cm with > 50% respirophasic  variation, suggesting RA pressure 8 mmHg.   Cardiac cath 05/23/15:  Coronary Stent Intervention  Left Heart Cath and Coronary Angiography    Conclusion   2nd RPLB lesion, 100% stenosed.  There is mild left ventricular systolic dysfunction.  Prox RCA to Mid RCA lesion, 95% stenosed. There is a 0% residual stenosis post intervention.  A drug-eluting stent was placed.   1. Single vessel occlusive CAD 2. Mild LV dysfunction 3. Successful stenting of the proximal RCA with DES.   Plan: DAPT for one year. Risk factor  modification. There is still significant ST elevation on Ecg felt to be related to residual PL2 occlusion. The patient is pain free. He may be a candidate for fast track DC.      ASSESSMENT AND PLAN:  1. CAD s/p  ST elevation myocardial infarction (STEMI) of inferior wall-August 2016.  A Synergy DES to pRCA to mRCA. This was a large vessel.  Metoprolol previously held due to fatigue and bradycardia. Stable class 1 angina  2. Left carotid stenosis by CT. Asymptomatic. Will check carotid dopplers.  If > 80% will refer to [facility] 3. Obstructive sleep apnea on CPAP. 4.  HTN (hypertension)- well controlled 5. Morbid obesity-  Encouraged more efforts at weight loss.  6.  Hyperlipidemia  Excellent control on  lipitor.     Current medicines are reviewed at length with the patient today.  The patient does not have concerns regarding medicines.  The following changes have been made:  no change  Labs/ tests ordered today include:   Orders Placed This Encounter  Procedures  . EKG 12-Lead  . VAS US CAROTID     Disposition:   FU with me TBD  Signed,  [name], MD  12/18/2020 9:27 AM    [facility] [address] [place], [place], [address] Phone [phone], Fax [phone]

## 2020-12-18 ENCOUNTER — Other Ambulatory Visit: Payer: Self-pay

## 2020-12-18 ENCOUNTER — Ambulatory Visit: Payer: Medicare PPO | Admitting: Cardiology

## 2020-12-18 ENCOUNTER — Encounter: Payer: Self-pay | Admitting: Cardiology

## 2020-12-18 VITALS — BP 126/80 | HR 58 | Ht 69.0 in | Wt 288.0 lb

## 2020-12-18 DIAGNOSIS — I25118 Atherosclerotic heart disease of native coronary artery with other forms of angina pectoris: Secondary | ICD-10-CM | POA: Diagnosis not present

## 2020-12-18 DIAGNOSIS — I10 Essential (primary) hypertension: Secondary | ICD-10-CM

## 2020-12-18 DIAGNOSIS — G4733 Obstructive sleep apnea (adult) (pediatric): Secondary | ICD-10-CM

## 2020-12-18 DIAGNOSIS — E7800 Pure hypercholesterolemia, unspecified: Secondary | ICD-10-CM

## 2020-12-18 DIAGNOSIS — I6522 Occlusion and stenosis of left carotid artery: Secondary | ICD-10-CM

## 2020-12-18 NOTE — Patient Instructions (Signed)
Medication Instructions:  Continue same medications   Lab Work: None ordered   Testing/Procedures: Carotid Dopplers   Follow-Up: At [facility], you and your health needs are our priority.  As part of our continuing mission to provide you with exceptional heart care, we have created designated Provider Care Teams.  These Care Teams include your primary Cardiologist (physician) and Advanced Practice Providers (APPs -  Physician Assistants and Nurse Practitioners) who all work together to provide you with the care you need, when you need it.  We recommend signing up for the patient portal called "MyChart".  Sign up information is provided on this After Visit Summary.  MyChart is used to connect with patients for Virtual Visits (Telemedicine).  Patients are able to view lab/test results, encounter notes, upcoming appointments, etc.  Non-urgent messages can be sent to your provider as well.   To learn more about what you can do with MyChart, go to [email or web address].    Your next appointment:  To be determined after test  The format for your next appointment:  Office   Provider:  Dr.Jordan

## 2021-01-03 ENCOUNTER — Other Ambulatory Visit: Payer: Self-pay

## 2021-01-03 ENCOUNTER — Ambulatory Visit (HOSPITAL_COMMUNITY)
Admission: RE | Admit: 2021-01-03 | Discharge: 2021-01-03 | Disposition: A | Discharge status: Home / Self Care | Payer: Medicare PPO | Source: Ambulatory Visit | Attending: Cardiovascular Disease | Admitting: Cardiovascular Disease

## 2021-01-03 ENCOUNTER — Other Ambulatory Visit (HOSPITAL_COMMUNITY): Payer: Self-pay | Admitting: Cardiology

## 2021-01-03 DIAGNOSIS — I6522 Occlusion and stenosis of left carotid artery: Secondary | ICD-10-CM

## 2021-01-03 DIAGNOSIS — I25118 Atherosclerotic heart disease of native coronary artery with other forms of angina pectoris: Secondary | ICD-10-CM | POA: Diagnosis not present

## 2021-01-03 DIAGNOSIS — E7800 Pure hypercholesterolemia, unspecified: Secondary | ICD-10-CM

## 2021-01-03 DIAGNOSIS — G4733 Obstructive sleep apnea (adult) (pediatric): Secondary | ICD-10-CM | POA: Diagnosis not present

## 2021-01-03 DIAGNOSIS — I10 Essential (primary) hypertension: Secondary | ICD-10-CM

## 2021-01-04 ENCOUNTER — Other Ambulatory Visit: Payer: Self-pay

## 2021-01-04 ENCOUNTER — Telehealth: Payer: Self-pay | Admitting: Cardiology

## 2021-01-04 DIAGNOSIS — I6522 Occlusion and stenosis of left carotid artery: Secondary | ICD-10-CM

## 2021-01-04 NOTE — Telephone Encounter (Signed)
Spoke to patient he stated his B/P was checked at carotid doppler appointment yesterday.He wanted to know the reading.Advised only systolic was recorded at 300.

## 2021-01-04 NOTE — Telephone Encounter (Signed)
New message:    Patient calling stating that some called concering lab results. Please call patient.

## 2021-02-19 DIAGNOSIS — M109 Gout, unspecified: Secondary | ICD-10-CM | POA: Diagnosis not present

## 2021-02-19 DIAGNOSIS — I25111 Atherosclerotic heart disease of native coronary artery with angina pectoris with documented spasm: Secondary | ICD-10-CM | POA: Diagnosis not present

## 2021-02-19 DIAGNOSIS — R7303 Prediabetes: Secondary | ICD-10-CM | POA: Diagnosis not present

## 2021-02-19 DIAGNOSIS — D751 Secondary polycythemia: Secondary | ICD-10-CM | POA: Diagnosis not present

## 2021-02-19 DIAGNOSIS — G4733 Obstructive sleep apnea (adult) (pediatric): Secondary | ICD-10-CM | POA: Diagnosis not present

## 2021-02-19 DIAGNOSIS — E7800 Pure hypercholesterolemia, unspecified: Secondary | ICD-10-CM | POA: Diagnosis not present

## 2021-02-19 DIAGNOSIS — I10 Essential (primary) hypertension: Secondary | ICD-10-CM | POA: Diagnosis not present

## 2021-02-19 DIAGNOSIS — I2510 Atherosclerotic heart disease of native coronary artery without angina pectoris: Secondary | ICD-10-CM | POA: Diagnosis not present

## 2021-02-19 DIAGNOSIS — N529 Male erectile dysfunction, unspecified: Secondary | ICD-10-CM | POA: Diagnosis not present

## 2021-05-10 DIAGNOSIS — U071 COVID-19: Secondary | ICD-10-CM | POA: Diagnosis not present

## 2021-05-28 DIAGNOSIS — H353131 Nonexudative age-related macular degeneration, bilateral, early dry stage: Secondary | ICD-10-CM | POA: Diagnosis not present

## 2021-09-21 DIAGNOSIS — N529 Male erectile dysfunction, unspecified: Secondary | ICD-10-CM | POA: Diagnosis not present

## 2021-09-21 DIAGNOSIS — Z23 Encounter for immunization: Secondary | ICD-10-CM | POA: Diagnosis not present

## 2021-09-21 DIAGNOSIS — I10 Essential (primary) hypertension: Secondary | ICD-10-CM | POA: Diagnosis not present

## 2021-09-21 DIAGNOSIS — M109 Gout, unspecified: Secondary | ICD-10-CM | POA: Diagnosis not present

## 2021-09-21 DIAGNOSIS — R7303 Prediabetes: Secondary | ICD-10-CM | POA: Diagnosis not present

## 2021-09-21 DIAGNOSIS — E7800 Pure hypercholesterolemia, unspecified: Secondary | ICD-10-CM | POA: Diagnosis not present

## 2021-09-21 DIAGNOSIS — Q8901 Asplenia (congenital): Secondary | ICD-10-CM | POA: Diagnosis not present

## 2021-09-21 DIAGNOSIS — Z1389 Encounter for screening for other disorder: Secondary | ICD-10-CM | POA: Diagnosis not present

## 2021-09-21 DIAGNOSIS — Z0000 Encounter for general adult medical examination without abnormal findings: Secondary | ICD-10-CM | POA: Diagnosis not present

## 2022-01-04 ENCOUNTER — Ambulatory Visit (HOSPITAL_COMMUNITY)
Admission: RE | Admit: 2022-01-04 | Discharge: 2022-01-04 | Disposition: A | Discharge status: Home / Self Care | Payer: Medicare PPO | Source: Ambulatory Visit | Attending: Cardiovascular Disease | Admitting: Cardiovascular Disease

## 2022-01-04 DIAGNOSIS — I6522 Occlusion and stenosis of left carotid artery: Secondary | ICD-10-CM | POA: Diagnosis not present

## 2022-01-07 ENCOUNTER — Other Ambulatory Visit: Payer: Self-pay

## 2022-01-07 DIAGNOSIS — I6522 Occlusion and stenosis of left carotid artery: Secondary | ICD-10-CM

## 2022-01-07 NOTE — Progress Notes (Signed)
as

## 2022-03-26 DIAGNOSIS — R7303 Prediabetes: Secondary | ICD-10-CM | POA: Diagnosis not present

## 2022-03-26 DIAGNOSIS — I2510 Atherosclerotic heart disease of native coronary artery without angina pectoris: Secondary | ICD-10-CM | POA: Diagnosis not present

## 2022-03-26 DIAGNOSIS — N529 Male erectile dysfunction, unspecified: Secondary | ICD-10-CM | POA: Diagnosis not present

## 2022-03-26 DIAGNOSIS — M109 Gout, unspecified: Secondary | ICD-10-CM | POA: Diagnosis not present

## 2022-03-26 DIAGNOSIS — E7800 Pure hypercholesterolemia, unspecified: Secondary | ICD-10-CM | POA: Diagnosis not present

## 2022-03-26 DIAGNOSIS — I10 Essential (primary) hypertension: Secondary | ICD-10-CM | POA: Diagnosis not present

## 2022-03-26 DIAGNOSIS — G4733 Obstructive sleep apnea (adult) (pediatric): Secondary | ICD-10-CM | POA: Diagnosis not present

## 2022-03-26 DIAGNOSIS — I25111 Atherosclerotic heart disease of native coronary artery with angina pectoris with documented spasm: Secondary | ICD-10-CM | POA: Diagnosis not present

## 2022-03-26 DIAGNOSIS — D751 Secondary polycythemia: Secondary | ICD-10-CM | POA: Diagnosis not present

## 2022-03-26 DIAGNOSIS — I252 Old myocardial infarction: Secondary | ICD-10-CM | POA: Diagnosis not present

## 2022-04-18 DIAGNOSIS — E119 Type 2 diabetes mellitus without complications: Secondary | ICD-10-CM | POA: Diagnosis not present

## 2022-07-22 DIAGNOSIS — C44612 Basal cell carcinoma of skin of right upper limb, including shoulder: Secondary | ICD-10-CM | POA: Diagnosis not present

## 2022-07-22 DIAGNOSIS — L570 Actinic keratosis: Secondary | ICD-10-CM | POA: Diagnosis not present

## 2022-07-22 DIAGNOSIS — L821 Other seborrheic keratosis: Secondary | ICD-10-CM | POA: Diagnosis not present

## 2022-07-22 DIAGNOSIS — D485 Neoplasm of uncertain behavior of skin: Secondary | ICD-10-CM | POA: Diagnosis not present

## 2022-07-22 DIAGNOSIS — L814 Other melanin hyperpigmentation: Secondary | ICD-10-CM | POA: Diagnosis not present

## 2022-07-22 DIAGNOSIS — D1801 Hemangioma of skin and subcutaneous tissue: Secondary | ICD-10-CM | POA: Diagnosis not present

## 2022-09-12 DIAGNOSIS — C44612 Basal cell carcinoma of skin of right upper limb, including shoulder: Secondary | ICD-10-CM | POA: Diagnosis not present

## 2022-10-08 NOTE — Progress Notes (Unsigned)
Cardiology Office Note   Date:  12/18/2020   ID:  Kevin Best, DOB 1946/04/10, MRN [id number]  PCP:  [name], MD  Cardiologist:    [name], MD   Chief Complaint  Patient presents with   New Patient (Initial Visit)   Coronary Artery Disease      History of Present Illness: Kevin Best is a 77 y.o. male who is seen at the request of Dr Moreen Fowler for evaluation of CAD. He has a history of HTN, HLD, morbid obesity with OSA. He was last seen in March 2018. He presented with inferolateral STEMI in August 2016.  Emergent cath lab and DES to RCA prox to mid. Single vessel disease. There was embolic occlusion of a PL2 branch.   On follow up today he is doing well from a cardiac standpoint. Denies any chest pain or dyspnea. Does note some stiffness with walking but does OK once he gets warmed up. Recently he had a swelling in his right neck. Was treated with antibiotics with improvement. Had CT done which showed incidental finding of left carotid stenosis. He denies any symptoms of TIA or stroke. Does have mild memory loss.     Past Medical History:  Diagnosis Date   ALLERGIC RHINITIS    CAD (coronary artery disease)    s/p inferolateral STEMI 05/23/2015 DES to prox-mid RCA, 100% occluded RPLB 2   Environmental allergies    Hyperlipidemia 05/24/2015   Hypertension    Prediabetes 05/25/2015   Sleep apnea     Past Surgical History:  Procedure Laterality Date   CARDIAC CATHETERIZATION N/A 05/23/2015   Procedure: Left Heart Cath and Coronary Angiography;  Surgeon:  [name], MD;  Location: [facility];  Service: Cardiovascular;  Laterality: N/A;   CARDIAC CATHETERIZATION  05/23/2015   Procedure: Coronary Stent Intervention;  Surgeon:  [name], MD;  Location: [facility];  Service: Cardiovascular;;   SPLENECTOMY, TOTAL  2005   THROAT SURGERY     UVULOPALATOPHARYNGOPLASTY       Current Outpatient Medications  Medication Sig Dispense Refill   aspirin  EC 81 MG EC tablet Take 1 tablet (81 mg total) by mouth daily.     atorvastatin (LIPITOR) 40 MG tablet 1 tablet     losartan (COZAAR) 100 MG tablet Take 100 mg by mouth daily.     nitroGLYCERIN (NITROSTAT) 0.4 MG SL tablet Place 1 tablet (0.4 mg total) under the tongue every 5 (five) minutes x 3 doses as needed for chest pain. Need appointment. 25 tablet 0   No current facility-administered medications for this visit.    Allergies:   Amlodipine besylate    Social History:  The patient  reports that he has never smoked. He has never used smokeless tobacco. He reports that he does not drink alcohol and does not use drugs.   Family History:  The patient's family history includes Melanoma in his father.    ROS:  Please see the history of present illness.   Otherwise, review of systems are positive for none.   All other systems are reviewed and negative.    PHYSICAL EXAM: VS:  BP 126/80 (BP Location: Right Arm, Patient Position: Sitting, Cuff Size: Large)   Pulse (!) 58   Ht '5\' 9"'$  (1.753 m)   Wt 288 lb (130.6 kg)   BMI 42.53 kg/m  , BMI Body mass index is 42.53 kg/m. GEN: Well nourished, obese, in no acute distress  HEENT: normal  Neck: no JVD, carotid bruits, or masses Cardiac: RRR; no murmurs, rubs, or gallops,no edema  Respiratory:  clear to auscultation bilaterally, normal work of breathing GI: soft, nontender, nondistended, + BS MS: no deformity or atrophy  Skin: warm and dry, no rash Neuro:  Strength and sensation are intact Psych: euthymic mood, full affect   EKG:  EKG is ordered today. The ekg ordered today demonstrates NSR with old inferior infarct. Poor R wave progression. Unchanged from 2017. I have personally reviewed and interpreted this study.    Recent Labs: No results found for requested labs within last 8760 hours.    Lipid Panel    Component Value Date/Time   CHOL 107 (L) 12/13/2015 0816   TRIG 140 12/13/2015 0816   HDL 37 (L) 12/13/2015 0816    CHOLHDL 2.9 12/13/2015 0816   VLDL 28 12/13/2015 0816   LDLCALC 42 12/13/2015 0816    Dated 08/21/20: cholesterol 104, triglycerides 146, HDL 44, LDL 34. A1c 5.5%. creatinine 1.12. CMET and TSH normal Dated 11/02/20 normal CBC Dated 03/26/22:  cholesterol 113, triglycerides 164, HDL 40, LDL 45. A1c 6%, CBC and CMET normal.  Wt Readings from Last 3 Encounters:  12/18/20 288 lb (130.6 kg)  12/09/16 (!) 318 lb 12.8 oz (144.6 kg)  06/12/16 (!) 312 lb (141.5 kg)      Other studies Reviewed: Additional studies/ records that were reviewed today include:   Echo 05/24/15: Study Conclusions   - Left ventricle: The cavity size was normal. Wall thickness was    increased in a pattern of mild LVH. Systolic function was normal.    The estimated ejection fraction was in the range of 60% to 65%.    Wall motion was normal; there were no regional wall motion    abnormalities. Doppler parameters are consistent with abnormal    left ventricular relaxation (grade 1 diastolic dysfunction).  - Aortic valve: There was no stenosis.  - Aorta: Ascending aortic diameter: 38 mm (S).  - Ascending aorta: The ascending aorta was mildly dilated.  - Mitral valve: There was no significant regurgitation.  - Right ventricle: Poorly visualized. The cavity size was normal.    Systolic function was normal.  - Pulmonary arteries: No complete TR doppler jet so unable to    estimate PA systolic pressure.  - Systemic veins: IVC measured 2.5 cm with > 50% respirophasic    variation, suggesting RA pressure 8 mmHg.   Cardiac cath 05/23/15:  Coronary Stent Intervention  Left Heart Cath and Coronary Angiography    Conclusion  2nd RPLB lesion, 100% stenosed. There is mild left ventricular systolic dysfunction. Prox RCA to Mid RCA lesion, 95% stenosed. There is a 0% residual stenosis post intervention. A drug-eluting stent was placed.   1. Single vessel occlusive CAD 2. Mild LV dysfunction 3. Successful stenting of the  proximal RCA with DES.    Plan: DAPT for one year. Risk factor modification. There is still significant ST elevation on Ecg felt to be related to residual PL2 occlusion. The patient is pain free. He may be a candidate for fast track DC.     Carotid dopplers 01/04/22: Summary:  Right Carotid: There is no evidence of stenosis in the right ICA. The                 extracranial vessels were near-normal with only minimal  wall                thickening or plaque.   Left  Carotid: Velocities in the left ICA are consistent with a 40-59%  stenosis.   Vertebrals: Left vertebral artery demonstrates antegrade flow. Right  vertebral              artery demonstrates high resistant flow.  Subclavians: Normal flow hemodynamics were seen in bilateral subclavian               arteries.   *See table(s) above for measurements and observations.  Suggest follow up study in 12 months.   ASSESSMENT AND PLAN:  1. CAD s/p  ST elevation myocardial infarction (STEMI) of inferior wall-August 2016.  A Synergy DES to pRCA to mRCA. This was a large vessel.  Metoprolol previously held due to fatigue and bradycardia. Stable class 1 angina  2. Left carotid stenosis moderate.  Asymptomatic. Monitor yearly 3.  Obstructive sleep apnea on CPAP. 4.  HTN (hypertension)- well controlled 5.  Morbid obesity-  Encouraged more efforts at weight loss.  6.   Hyperlipidemia  Excellent control on lipitor.     Current medicines are reviewed at length with the patient today.  The patient does not have concerns regarding medicines.  The following changes have been made:  no change  Labs/ tests ordered today include:   Orders Placed This Encounter  Procedures   EKG 12-Lead   VAS US CAROTID     Disposition:   FU with me TBD  Signed,  [name], MD  12/18/2020 9:27 AM    [facility] [address], [place], [place], [address] Phone [phone], Fax [phone]

## 2022-10-17 ENCOUNTER — Ambulatory Visit: Payer: Medicare Other | Attending: Cardiology | Admitting: Cardiology

## 2022-10-17 ENCOUNTER — Encounter: Payer: Self-pay | Admitting: Cardiology

## 2022-10-17 VITALS — BP 142/78 | HR 70 | Ht 69.5 in | Wt 303.0 lb

## 2022-10-17 DIAGNOSIS — I10 Essential (primary) hypertension: Secondary | ICD-10-CM

## 2022-10-17 DIAGNOSIS — I25118 Atherosclerotic heart disease of native coronary artery with other forms of angina pectoris: Secondary | ICD-10-CM | POA: Diagnosis not present

## 2022-10-17 DIAGNOSIS — I6522 Occlusion and stenosis of left carotid artery: Secondary | ICD-10-CM

## 2022-10-17 DIAGNOSIS — G4733 Obstructive sleep apnea (adult) (pediatric): Secondary | ICD-10-CM

## 2022-10-17 DIAGNOSIS — E7800 Pure hypercholesterolemia, unspecified: Secondary | ICD-10-CM | POA: Diagnosis not present

## 2022-10-17 NOTE — Patient Instructions (Signed)
Medication Instructions:  Continue same medications *If you need a refill on your cardiac medications before your next appointment, please call your pharmacy*   Lab Work: None ordered   Testing/Procedures: Schedule Carotid dopplers 12/2022   Follow-Up: At [facility], you and your health needs are our priority.  As part of our continuing mission to provide you with exceptional heart care, we have created designated Provider Care Teams.  These Care Teams include your primary Cardiologist (physician) and Advanced Practice Providers (APPs -  Physician Assistants and Nurse Practitioners) who all work together to provide you with the care you need, when you need it.  We recommend signing up for the patient portal called "MyChart".  Sign up information is provided on this After Visit Summary.  MyChart is used to connect with patients for Virtual Visits (Telemedicine).  Patients are able to view lab/test results, encounter notes, upcoming appointments, etc.  Non-urgent messages can be sent to your provider as well.   To learn more about what you can do with MyChart, go to [email or web address].    Your next appointment:  1 year   Call in Oct to schedule Jan appointment     Provider:  Dr.Jordan

## 2022-11-04 DIAGNOSIS — R7303 Prediabetes: Secondary | ICD-10-CM | POA: Diagnosis not present

## 2022-11-04 DIAGNOSIS — I10 Essential (primary) hypertension: Secondary | ICD-10-CM | POA: Diagnosis not present

## 2022-11-04 DIAGNOSIS — M109 Gout, unspecified: Secondary | ICD-10-CM | POA: Diagnosis not present

## 2022-11-04 DIAGNOSIS — I6522 Occlusion and stenosis of left carotid artery: Secondary | ICD-10-CM | POA: Diagnosis not present

## 2022-11-04 DIAGNOSIS — Z0000 Encounter for general adult medical examination without abnormal findings: Secondary | ICD-10-CM | POA: Diagnosis not present

## 2022-11-04 DIAGNOSIS — Z23 Encounter for immunization: Secondary | ICD-10-CM | POA: Diagnosis not present

## 2022-11-04 DIAGNOSIS — I252 Old myocardial infarction: Secondary | ICD-10-CM | POA: Diagnosis not present

## 2022-11-04 DIAGNOSIS — Q8901 Asplenia (congenital): Secondary | ICD-10-CM | POA: Diagnosis not present

## 2022-11-04 DIAGNOSIS — I25111 Atherosclerotic heart disease of native coronary artery with angina pectoris with documented spasm: Secondary | ICD-10-CM | POA: Diagnosis not present

## 2022-11-04 DIAGNOSIS — E7800 Pure hypercholesterolemia, unspecified: Secondary | ICD-10-CM | POA: Diagnosis not present

## 2022-12-16 DIAGNOSIS — I6522 Occlusion and stenosis of left carotid artery: Secondary | ICD-10-CM | POA: Diagnosis not present

## 2022-12-16 DIAGNOSIS — I2510 Atherosclerotic heart disease of native coronary artery without angina pectoris: Secondary | ICD-10-CM | POA: Diagnosis not present

## 2022-12-16 DIAGNOSIS — I10 Essential (primary) hypertension: Secondary | ICD-10-CM | POA: Diagnosis not present

## 2022-12-16 DIAGNOSIS — Z86010 Personal history of colonic polyps: Secondary | ICD-10-CM | POA: Diagnosis not present

## 2022-12-17 ENCOUNTER — Ambulatory Visit (HOSPITAL_COMMUNITY)
Admission: RE | Admit: 2022-12-17 | Discharge: 2022-12-17 | Disposition: A | Discharge status: Home / Self Care | Payer: Medicare Other | Source: Ambulatory Visit | Attending: Cardiovascular Disease | Admitting: Cardiovascular Disease

## 2022-12-17 DIAGNOSIS — I6522 Occlusion and stenosis of left carotid artery: Secondary | ICD-10-CM | POA: Insufficient documentation

## 2022-12-20 ENCOUNTER — Other Ambulatory Visit: Payer: Self-pay

## 2022-12-20 DIAGNOSIS — I6522 Occlusion and stenosis of left carotid artery: Secondary | ICD-10-CM

## 2023-01-02 DIAGNOSIS — K1121 Acute sialoadenitis: Secondary | ICD-10-CM | POA: Diagnosis not present

## 2023-02-03 DIAGNOSIS — I10 Essential (primary) hypertension: Secondary | ICD-10-CM | POA: Diagnosis not present

## 2023-02-03 DIAGNOSIS — D849 Immunodeficiency, unspecified: Secondary | ICD-10-CM | POA: Diagnosis not present

## 2023-02-03 DIAGNOSIS — K1121 Acute sialoadenitis: Secondary | ICD-10-CM | POA: Diagnosis not present

## 2023-02-03 DIAGNOSIS — M25552 Pain in left hip: Secondary | ICD-10-CM | POA: Diagnosis not present

## 2023-02-05 ENCOUNTER — Other Ambulatory Visit: Payer: Self-pay | Admitting: Family Medicine

## 2023-02-05 DIAGNOSIS — K1121 Acute sialoadenitis: Secondary | ICD-10-CM

## 2023-02-13 IMAGING — CT CT NECK W/ CM
3 of 4 series · 6 of 14 positions shown, 7 images · IV contrast (iopamidol)
Comparison: Report from neck ultrasound dated 11/02/2020 (images
not available)

CLINICAL DATA: Right parotid mass.

Creatinine was obtained on site at [HOSPITAL] at [HOSPITAL].
Results: Creatinine 1.2 mg/dL.
EXAM:
CT NECK WITH CONTRAST
TECHNIQUE: Multidetector CT imaging of the neck was performed using the
standard protocol following the bolus administration of intravenous
contrast.
CONTRAST:  75mL ZSOUP4-XAA IOPAMIDOL (ZSOUP4-XAA) INJECTION 61%

[Series 3: neck · axial · 0.63mm/px · z∈[-304,-208]mm · 2 of 146 slices shown]
[im 49/146  bone]
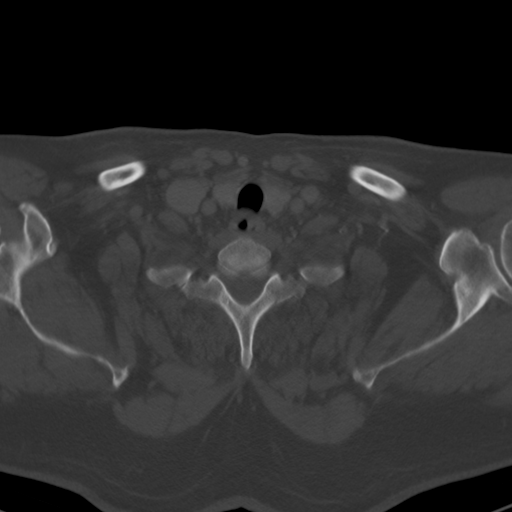
[im 97/146  bone]
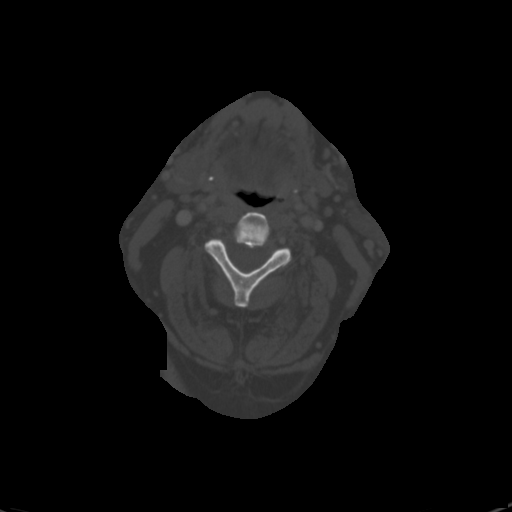

[Series 6: angled axial-oropharynx · axial · 0.56mm/px · z∈[-302,-206]mm · 2 of 144 slices shown, 3 images]
[im 48/144  soft-tissue]
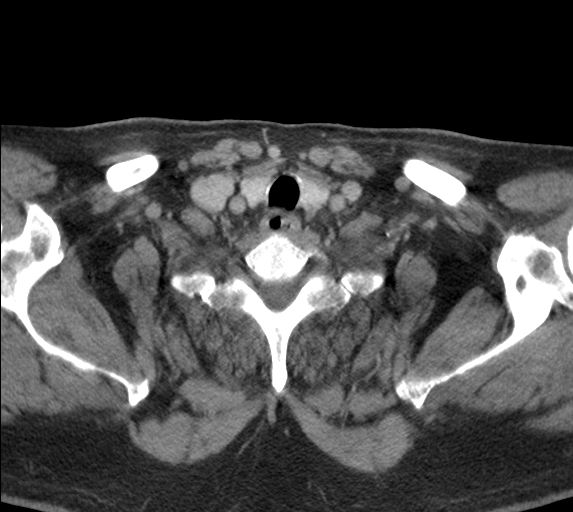
[im 48/144  bone]
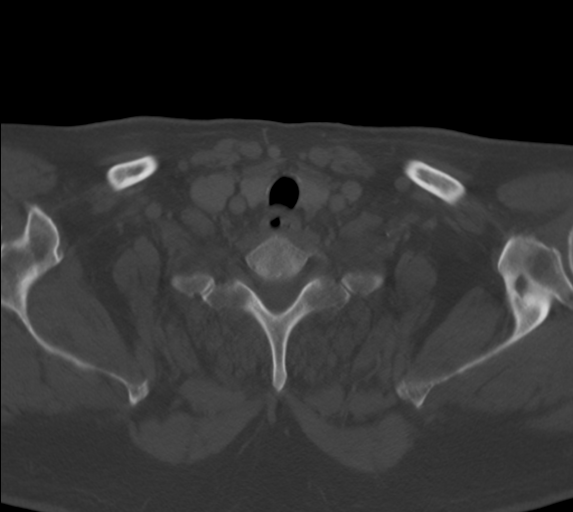
[im 96/144  bone]
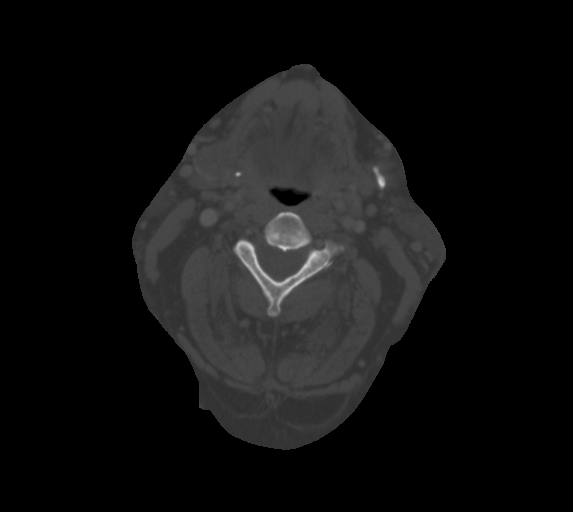

[Series 7: angled (person_name) · axial · 0.58mm/px · z∈[-330,-235]mm · 2 of 145 slices shown]
[im 49/145  bone]
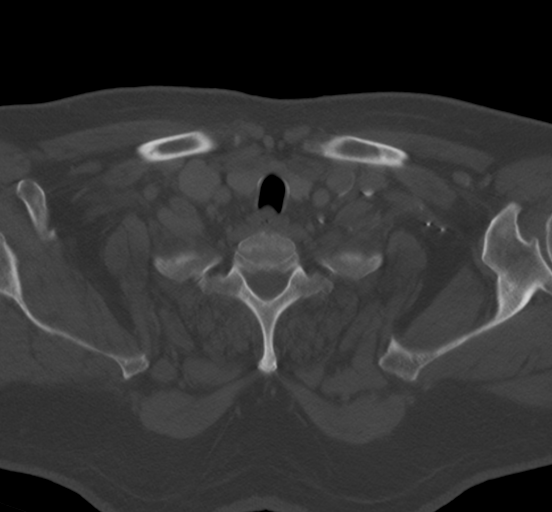
[im 97/145  bone]
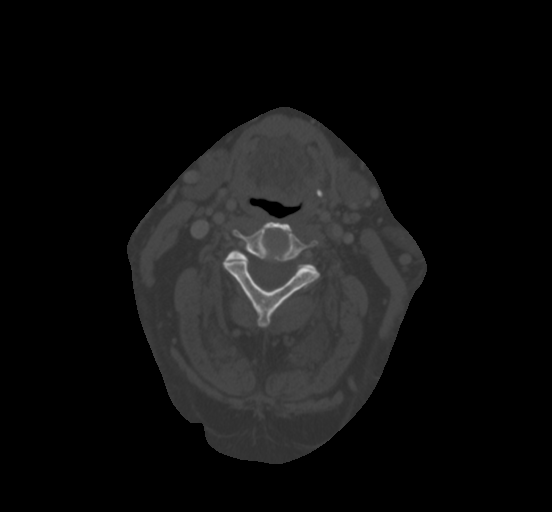

[6 of 14 positions shown; findings below may reference images not displayed]

FINDINGS: Pharynx and larynx: No evidence of mass or swelling. Widely patent
airway. No fluid collection or inflammatory changes in the
parapharyngeal or retropharyngeal spaces.

Salivary glands: The skin marker overlies the right parotid gland.
No underlying mass or fluid collection is evident, and the
subcentimeter cystic foci described on ultrasound are not apparent
on this CT. The left parotid gland is unremarkable aside from a 7 mm
enhancing soft tissue nodule in the superficial aspect of the gland
inferiorly. The submandibular glands are unremarkable. No salivary
stone or acute inflammation is identified.

Thyroid: Unremarkable.

Lymph nodes: No enlarged or suspicious lymph nodes in the neck.

Vascular: Prominent calcified and soft plaque in the proximal left
ICA with suspected 75% or greater stenosis, not evaluated in detail
on this non angiographic study.

Limited intracranial: Unremarkable.

Visualized orbits: Not imaged.

Mastoids and visualized paranasal sinuses: Clear.

Skeleton: No suspicious osseous lesion. Bridging vertebral
osteophytes in the thoracic spine.

Upper chest: Clear lung apices.

Other: None.
IMPRESSION: 1. No right parotid mass identified.
2. 7 mm nodule in the left parotid gland, potentially a lymph node
although a small parotid neoplasm is not excluded.
3. Prominent plaque in the proximal left ICA with suspected 75% or
greater stenosis. Consider carotid Doppler ultrasound for further
evaluation.

## 2023-02-17 ENCOUNTER — Ambulatory Visit
Admission: RE | Admit: 2023-02-17 | Discharge: 2023-02-17 | Disposition: A | Discharge status: Home / Self Care | Payer: Medicare Other | Source: Ambulatory Visit | Attending: Family Medicine | Admitting: Family Medicine

## 2023-02-17 DIAGNOSIS — D110 Benign neoplasm of parotid gland: Secondary | ICD-10-CM | POA: Diagnosis not present

## 2023-02-17 DIAGNOSIS — K1121 Acute sialoadenitis: Secondary | ICD-10-CM

## 2023-03-14 DIAGNOSIS — M1612 Unilateral primary osteoarthritis, left hip: Secondary | ICD-10-CM | POA: Diagnosis not present

## 2023-03-31 DIAGNOSIS — M1612 Unilateral primary osteoarthritis, left hip: Secondary | ICD-10-CM | POA: Diagnosis not present

## 2023-04-04 DIAGNOSIS — K118 Other diseases of salivary glands: Secondary | ICD-10-CM | POA: Diagnosis not present

## 2023-04-04 DIAGNOSIS — G4733 Obstructive sleep apnea (adult) (pediatric): Secondary | ICD-10-CM | POA: Diagnosis not present

## 2023-04-23 DIAGNOSIS — I10 Essential (primary) hypertension: Secondary | ICD-10-CM | POA: Diagnosis not present

## 2023-04-30 DIAGNOSIS — M1612 Unilateral primary osteoarthritis, left hip: Secondary | ICD-10-CM | POA: Diagnosis not present

## 2023-05-05 DIAGNOSIS — I252 Old myocardial infarction: Secondary | ICD-10-CM | POA: Diagnosis not present

## 2023-05-05 DIAGNOSIS — M109 Gout, unspecified: Secondary | ICD-10-CM | POA: Diagnosis not present

## 2023-05-05 DIAGNOSIS — D751 Secondary polycythemia: Secondary | ICD-10-CM | POA: Diagnosis not present

## 2023-05-05 DIAGNOSIS — I25111 Atherosclerotic heart disease of native coronary artery with angina pectoris with documented spasm: Secondary | ICD-10-CM | POA: Diagnosis not present

## 2023-05-05 DIAGNOSIS — E7800 Pure hypercholesterolemia, unspecified: Secondary | ICD-10-CM | POA: Diagnosis not present

## 2023-05-05 DIAGNOSIS — E1122 Type 2 diabetes mellitus with diabetic chronic kidney disease: Secondary | ICD-10-CM | POA: Diagnosis not present

## 2023-05-05 DIAGNOSIS — D849 Immunodeficiency, unspecified: Secondary | ICD-10-CM | POA: Diagnosis not present

## 2023-05-05 DIAGNOSIS — Q8901 Asplenia (congenital): Secondary | ICD-10-CM | POA: Diagnosis not present

## 2023-05-05 DIAGNOSIS — E1169 Type 2 diabetes mellitus with other specified complication: Secondary | ICD-10-CM | POA: Diagnosis not present

## 2023-05-05 DIAGNOSIS — N1831 Chronic kidney disease, stage 3a: Secondary | ICD-10-CM | POA: Diagnosis not present

## 2023-05-07 DIAGNOSIS — G4733 Obstructive sleep apnea (adult) (pediatric): Secondary | ICD-10-CM | POA: Diagnosis not present

## 2023-05-08 DIAGNOSIS — I10 Essential (primary) hypertension: Secondary | ICD-10-CM | POA: Diagnosis not present

## 2023-05-08 DIAGNOSIS — G4733 Obstructive sleep apnea (adult) (pediatric): Secondary | ICD-10-CM | POA: Diagnosis not present

## 2023-05-26 DIAGNOSIS — G4733 Obstructive sleep apnea (adult) (pediatric): Secondary | ICD-10-CM | POA: Diagnosis not present

## 2023-06-03 DIAGNOSIS — G4733 Obstructive sleep apnea (adult) (pediatric): Secondary | ICD-10-CM | POA: Diagnosis not present

## 2023-07-24 DIAGNOSIS — L821 Other seborrheic keratosis: Secondary | ICD-10-CM | POA: Diagnosis not present

## 2023-07-24 DIAGNOSIS — L814 Other melanin hyperpigmentation: Secondary | ICD-10-CM | POA: Diagnosis not present

## 2023-07-24 DIAGNOSIS — D225 Melanocytic nevi of trunk: Secondary | ICD-10-CM | POA: Diagnosis not present

## 2023-07-24 DIAGNOSIS — Z85828 Personal history of other malignant neoplasm of skin: Secondary | ICD-10-CM | POA: Diagnosis not present

## 2023-07-24 DIAGNOSIS — D0439 Carcinoma in situ of skin of other parts of face: Secondary | ICD-10-CM | POA: Diagnosis not present

## 2023-07-24 DIAGNOSIS — L570 Actinic keratosis: Secondary | ICD-10-CM | POA: Diagnosis not present

## 2023-07-24 DIAGNOSIS — D485 Neoplasm of uncertain behavior of skin: Secondary | ICD-10-CM | POA: Diagnosis not present

## 2023-07-24 DIAGNOSIS — Z08 Encounter for follow-up examination after completed treatment for malignant neoplasm: Secondary | ICD-10-CM | POA: Diagnosis not present

## 2023-08-05 DIAGNOSIS — E119 Type 2 diabetes mellitus without complications: Secondary | ICD-10-CM | POA: Diagnosis not present

## 2023-08-05 DIAGNOSIS — H353131 Nonexudative age-related macular degeneration, bilateral, early dry stage: Secondary | ICD-10-CM | POA: Diagnosis not present

## 2023-10-16 ENCOUNTER — Encounter: Payer: Self-pay | Admitting: Cardiology

## 2023-10-16 ENCOUNTER — Ambulatory Visit: Payer: Medicare Other | Attending: Cardiology | Admitting: Cardiology

## 2023-10-16 VITALS — BP 134/86 | HR 74 | Ht 70.0 in | Wt 308.0 lb

## 2023-10-16 DIAGNOSIS — I6522 Occlusion and stenosis of left carotid artery: Secondary | ICD-10-CM | POA: Diagnosis not present

## 2023-10-16 DIAGNOSIS — G4733 Obstructive sleep apnea (adult) (pediatric): Secondary | ICD-10-CM | POA: Diagnosis not present

## 2023-10-16 DIAGNOSIS — E7800 Pure hypercholesterolemia, unspecified: Secondary | ICD-10-CM

## 2023-10-16 DIAGNOSIS — I10 Essential (primary) hypertension: Secondary | ICD-10-CM

## 2023-10-16 DIAGNOSIS — I25118 Atherosclerotic heart disease of native coronary artery with other forms of angina pectoris: Secondary | ICD-10-CM | POA: Diagnosis not present

## 2023-10-16 NOTE — Progress Notes (Signed)
 Cardiology Office Note   Date:  10/16/2023   ID:  Kevin Best, DOB 08/13/1946, MRN [id number]  PCP:  Seabron Lenis, MD  Cardiologist:   Willella Harding, MD   Chief Complaint  Patient presents with   Coronary Artery Disease      History of Present Illness: Kevin Best is a 78 y.o. male who is seen  for evaluation of CAD. He has a history of HTN, HLD, morbid obesity with OSA.  He presented with inferolateral STEMI in August 2016. Emergent cath lab and DES to RCA prox to mid. Single vessel disease. There was embolic occlusion of a PL2 branch.   On follow up today he is doing well from a cardiac standpoint. Denies any chest pain or dyspnea. Compared to 2 years ago he has gained 20 lbs. He denies any chest pain or dyspnea. He has been sedentary due to a hip issue but this has improved. Was just started on Ozempic. His only complaint is of fatigue    Past Medical History:  Diagnosis Date   ALLERGIC RHINITIS    CAD (coronary artery disease)    s/p inferolateral STEMI 05/23/2015 DES to prox-mid RCA, 100% occluded RPLB 2   Environmental allergies    Hyperlipidemia 05/24/2015   Hypertension    Prediabetes 05/25/2015   Sleep apnea     Past Surgical History:  Procedure Laterality Date   CARDIAC CATHETERIZATION N/A 05/23/2015   Procedure: Left Heart Cath and Coronary Angiography;  Surgeon: Jairen Goldfarb M Jaun Galluzzo, MD;  Location: [facility] INVASIVE CV LAB;  Service: Cardiovascular;  Laterality: N/A;   CARDIAC CATHETERIZATION  05/23/2015   Procedure: Coronary Stent Intervention;  Surgeon: Rosemarie Galvis M Jamarr Treinen, MD;  Location: [facility] INVASIVE CV LAB;  Service: Cardiovascular;;   SPLENECTOMY, TOTAL  2005   THROAT SURGERY     UVULOPALATOPHARYNGOPLASTY       Current Outpatient Medications  Medication Sig Dispense Refill   aspirin  EC 81 MG EC tablet Take 1 tablet (81 mg total) by mouth daily.     atorvastatin  (LIPITOR ) 40 MG tablet 1 tablet     COD LIVER OIL PO Take by mouth.     DHA-EPA-Vit B6-B12-Folic Acid  (CARDIOVID PLUS PO) Take by mouth.     losartan  (COZAAR ) 100 MG tablet Take 100 mg by mouth daily.     Multiple Vitamins-Minerals (PRESERVISION AREDS 2 PO) Take by mouth.     nitroGLYCERIN  (NITROSTAT ) 0.4 MG SL tablet Place 1 tablet (0.4 mg total) under the tongue every 5 (five) minutes x 3 doses as needed for chest pain. Need appointment. 25 tablet 0   Probiotic Product (UP4 PROBIOTICS MENS PO) Take by mouth.     Semaglutide,0.25 or 0.5MG /DOS, (OZEMPIC, 0.25 OR 0.5 MG/DOSE,) 2 MG/1.5ML SOPN Inject into the skin.     cyanocobalamin (VITAMIN B12) 1000 MCG tablet Take 1,000 mcg by mouth daily. (Patient not taking: Reported on 10/16/2023)     No current facility-administered medications for this visit.    Allergies:   Amlodipine besylate    Social History:  The patient  reports that he has never smoked. He has never used smokeless tobacco. He reports that he does not drink alcohol and does not use drugs.   Family History:  The patient's family history includes Melanoma in his father.    ROS:  Please see the history of present illness.   Otherwise, review of systems are positive for none.   All other systems are reviewed and negative.  PHYSICAL EXAM: VS:  BP 134/86 (BP Location: Right Arm, Patient Position: Sitting, Cuff Size: Large)   Pulse 74   Ht 5' 10 (1.778 m)   Wt (!) 308 lb (139.7 kg)   SpO2 95%   BMI 44.19 kg/m  , BMI Body mass index is 44.19 kg/m. GEN: Well nourished, obese, in no acute distress  HEENT: normal  Neck: no JVD, carotid bruits, or masses Cardiac: RRR; no murmurs, rubs, or gallops,no edema  Respiratory:  clear to auscultation bilaterally, normal work of breathing GI: soft, nontender, nondistended, + BS MS: no deformity or atrophy  Skin: warm and dry, no rash Neuro:  Strength and sensation are intact Psych: euthymic mood, full affect   EKG Interpretation Date/Time:  Thursday October 16 2023 08:33:24 EST Ventricular Rate:  74 PR Interval:  208 QRS  Duration:  124 QT Interval:  414 QTC Calculation: 459 R Axis:   -25  Text Interpretation: Normal sinus rhythm  Non-specific intra-ventricular conduction block Inferior infarct (cited on or before 23-May-2015) When compared with ECG of 24-May-2015 06:55, QRS duration has increased Borderline criteria for Lateral infarct are now Present ST no longer elevated in Inferior leads Nonspecific T wave abnormality now evident in Inferior leads  Confirmed by Nimrit Kehres [phone]) on 10/16/2023 8:37:15 AM     Recent Labs: No results found for requested labs within last 365 days.    Lipid Panel    Component Value Date/Time   CHOL 107 (L) 12/13/2015 0816   TRIG 140 12/13/2015 0816   HDL 37 (L) 12/13/2015 0816   CHOLHDL 2.9 12/13/2015 0816   VLDL 28 12/13/2015 0816   LDLCALC 42 12/13/2015 0816    Dated 08/21/20: cholesterol 104, triglycerides 146, HDL 44, LDL 34. A1c 5.5%. creatinine 1.12. CMET and TSH normal Dated 11/02/20 normal CBC Dated 03/26/22:  cholesterol 113, triglycerides 164, HDL 40, LDL 45. A1c 6%, CBC and CMET normal. Dated 05/05/23: cholesterol 134, triglycerides 163, HDL 49, LDL 57. CMET and CBC OK  Wt Readings from Last 3 Encounters:  10/16/23 (!) 308 lb (139.7 kg)  10/17/22 (!) 303 lb (137.4 kg)  12/18/20 288 lb (130.6 kg)      Other studies Reviewed: Additional studies/ records that were reviewed today include:   Echo 05/24/15: Study Conclusions   - Left ventricle: The cavity size was normal. Wall thickness was    increased in a pattern of mild LVH. Systolic function was normal.    The estimated ejection fraction was in the range of 60% to 65%.    Wall motion was normal; there were no regional wall motion    abnormalities. Doppler parameters are consistent with abnormal    left ventricular relaxation (grade 1 diastolic dysfunction).  - Aortic valve: There was no stenosis.  - Aorta: Ascending aortic diameter: 38 mm (S).  - Ascending aorta: The ascending aorta was  mildly dilated.  - Mitral valve: There was no significant regurgitation.  - Right ventricle: Poorly visualized. The cavity size was normal.    Systolic function was normal.  - Pulmonary arteries: No complete TR doppler jet so unable to    estimate PA systolic pressure.  - Systemic veins: IVC measured 2.5 cm with > 50% respirophasic    variation, suggesting RA pressure 8 mmHg.   Cardiac cath 05/23/15:  Coronary Stent Intervention  Left Heart Cath and Coronary Angiography    Conclusion  2nd RPLB lesion, 100% stenosed. There is mild left ventricular systolic dysfunction. Prox RCA to Mid RCA lesion,  95% stenosed. There is a 0% residual stenosis post intervention. A drug-eluting stent was placed.   1. Single vessel occlusive CAD 2. Mild LV dysfunction 3. Successful stenting of the proximal RCA with DES.    Plan: DAPT for one year. Risk factor modification. There is still significant ST elevation on Ecg felt to be related to residual PL2 occlusion. The patient is pain free. He may be a candidate for fast track DC.     Carotid dopplers 01/04/22: Summary:  Right Carotid: There is no evidence of stenosis in the right ICA. The                 extracranial vessels were near-normal with only minimal  wall                thickening or plaque.   Left Carotid: Velocities in the left ICA are consistent with a 40-59%  stenosis.   Vertebrals: Left vertebral artery demonstrates antegrade flow. Right  vertebral              artery demonstrates high resistant flow.  Subclavians: Normal flow hemodynamics were seen in bilateral subclavian               arteries.   *See table(s) above for measurements and observations.  Suggest follow up study in 12 months.   ASSESSMENT AND PLAN:  1. CAD s/p  ST elevation myocardial infarction (STEMI) of inferior wall-August 2016. Treated with  Synergy DES to pRCA to mRCA. Metoprolol  previously held due to fatigue and bradycardia. Stable class 1 angina  2.  Left carotid stenosis moderate.  Asymptomatic. Monitor yearly. Last doppler in March 2024 3.  Obstructive sleep apnea on CPAP. 4.  HTN (hypertension)- well controlled 5.  Morbid obesity-  Encouraged more efforts at weight loss. Agree with GLP 1 agonist.  Needs to increase aerobic activity. 6.  Hyperlipidemia  Excellent control on lipitor .     Current medicines are reviewed at length with the patient today.  The patient does not have concerns regarding medicines.  The following changes have been made:  no change  Labs/ tests ordered today include:   Orders Placed This Encounter  Procedures   EKG 12-Lead     Disposition:   FU one year  Signed, Oran Dillenburg, MD  10/16/2023 8:47 AM    [facility] [address] [place], [place], [address] Phone [phone], Fax [phone]

## 2023-10-16 NOTE — Patient Instructions (Signed)
 Medication Instructions:  Continue same medications *If you need a refill on your cardiac medications before your next appointment, please call your pharmacy*   Lab Work: None ordered   Testing/Procedures: None ordered   Follow-Up: At [facility], you and your health needs are our priority.  As part of our continuing mission to provide you with exceptional heart care, we have created designated Provider Care Teams.  These Care Teams include your primary Cardiologist (physician) and Advanced Practice Providers (APPs -  Physician Assistants and Nurse Practitioners) who all work together to provide you with the care you need, when you need it.  We recommend signing up for the patient portal called "MyChart".  Sign up information is provided on this After Visit Summary.  MyChart is used to connect with patients for Virtual Visits (Telemedicine).  Patients are able to view lab/test results, encounter notes, upcoming appointments, etc.  Non-urgent messages can be sent to your provider as well.   To learn more about what you can do with MyChart, go to [email or web address].    Your next appointment:  1 year   Call in Oct to schedule Jan appointment     Provider:  Dr.Jordan

## 2023-12-01 DIAGNOSIS — I252 Old myocardial infarction: Secondary | ICD-10-CM | POA: Diagnosis not present

## 2023-12-01 DIAGNOSIS — G4733 Obstructive sleep apnea (adult) (pediatric): Secondary | ICD-10-CM | POA: Diagnosis not present

## 2023-12-01 DIAGNOSIS — D751 Secondary polycythemia: Secondary | ICD-10-CM | POA: Diagnosis not present

## 2023-12-01 DIAGNOSIS — E1169 Type 2 diabetes mellitus with other specified complication: Secondary | ICD-10-CM | POA: Diagnosis not present

## 2023-12-01 DIAGNOSIS — I25111 Atherosclerotic heart disease of native coronary artery with angina pectoris with documented spasm: Secondary | ICD-10-CM | POA: Diagnosis not present

## 2023-12-01 DIAGNOSIS — E7800 Pure hypercholesterolemia, unspecified: Secondary | ICD-10-CM | POA: Diagnosis not present

## 2023-12-01 DIAGNOSIS — M109 Gout, unspecified: Secondary | ICD-10-CM | POA: Diagnosis not present

## 2023-12-01 DIAGNOSIS — I10 Essential (primary) hypertension: Secondary | ICD-10-CM | POA: Diagnosis not present

## 2023-12-01 DIAGNOSIS — I6522 Occlusion and stenosis of left carotid artery: Secondary | ICD-10-CM | POA: Diagnosis not present

## 2023-12-01 DIAGNOSIS — Z0000 Encounter for general adult medical examination without abnormal findings: Secondary | ICD-10-CM | POA: Diagnosis not present

## 2023-12-15 DIAGNOSIS — L578 Other skin changes due to chronic exposure to nonionizing radiation: Secondary | ICD-10-CM | POA: Diagnosis not present

## 2023-12-15 DIAGNOSIS — L570 Actinic keratosis: Secondary | ICD-10-CM | POA: Diagnosis not present

## 2023-12-15 DIAGNOSIS — D0439 Carcinoma in situ of skin of other parts of face: Secondary | ICD-10-CM | POA: Diagnosis not present

## 2023-12-29 ENCOUNTER — Other Ambulatory Visit: Payer: Self-pay

## 2023-12-29 ENCOUNTER — Other Ambulatory Visit (HOSPITAL_COMMUNITY): Payer: Self-pay | Admitting: Cardiology

## 2023-12-29 ENCOUNTER — Ambulatory Visit (HOSPITAL_COMMUNITY)
Admission: RE | Admit: 2023-12-29 | Discharge: 2023-12-29 | Disposition: A | Discharge status: Home / Self Care | Source: Ambulatory Visit | Attending: Cardiology | Admitting: Cardiology

## 2023-12-29 DIAGNOSIS — I6522 Occlusion and stenosis of left carotid artery: Secondary | ICD-10-CM

## 2024-02-03 DIAGNOSIS — H35363 Drusen (degenerative) of macula, bilateral: Secondary | ICD-10-CM | POA: Diagnosis not present

## 2024-02-17 DIAGNOSIS — J069 Acute upper respiratory infection, unspecified: Secondary | ICD-10-CM | POA: Diagnosis not present

## 2024-02-25 DIAGNOSIS — L578 Other skin changes due to chronic exposure to nonionizing radiation: Secondary | ICD-10-CM | POA: Diagnosis not present

## 2024-02-25 DIAGNOSIS — Z86007 Personal history of in-situ neoplasm of skin: Secondary | ICD-10-CM | POA: Diagnosis not present

## 2024-02-25 DIAGNOSIS — Z09 Encounter for follow-up examination after completed treatment for conditions other than malignant neoplasm: Secondary | ICD-10-CM | POA: Diagnosis not present

## 2024-07-13 DIAGNOSIS — Q8901 Asplenia (congenital): Secondary | ICD-10-CM | POA: Diagnosis not present

## 2024-07-13 DIAGNOSIS — H6691 Otitis media, unspecified, right ear: Secondary | ICD-10-CM | POA: Diagnosis not present

## 2024-08-24 NOTE — Progress Notes (Signed)
 Kevin Best                                          MRN: [id number]   08/24/2024   The [facility] Quality Team Specialist reviewed this patient medical record for the purposes of chart review for care gap closure. The following were reviewed: chart review for care gap closure-kidney health evaluation for diabetes:eGFR  and uACR.    [facility]BCI Quality Team

## 2024-12-27 ENCOUNTER — Ambulatory Visit: Admitting: Cardiology
